# Patient Record
Sex: Female | Born: 1990 | Race: Black or African American | Hispanic: No | Marital: Single | Smoking: Current every day smoker
Health system: Southern US, Community
[De-identification: ages and names within clinical notes are randomized; demographics above are authoritative.]

## PROBLEM LIST (undated history)

## (undated) DIAGNOSIS — J45909 Unspecified asthma, uncomplicated: Secondary | ICD-10-CM

## (undated) DIAGNOSIS — R519 Headache, unspecified: Secondary | ICD-10-CM

## (undated) DIAGNOSIS — R51 Headache: Secondary | ICD-10-CM

---
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
## 2008-11-29 ENCOUNTER — Emergency Department (HOSPITAL_COMMUNITY): Admission: EM | Admit: 2008-11-29 | Discharge: 2008-11-29 | Payer: Self-pay | Admitting: Family Medicine

## 2009-06-24 ENCOUNTER — Emergency Department (HOSPITAL_COMMUNITY): Admission: EM | Admit: 2009-06-24 | Discharge: 2009-06-24 | Payer: Self-pay | Admitting: Emergency Medicine

## 2009-09-07 ENCOUNTER — Emergency Department (HOSPITAL_COMMUNITY): Admission: EM | Admit: 2009-09-07 | Discharge: 2009-09-07 | Payer: Self-pay | Admitting: Emergency Medicine

## 2010-02-28 ENCOUNTER — Emergency Department (HOSPITAL_COMMUNITY): Admission: EM | Admit: 2010-02-28 | Discharge: 2010-02-28 | Payer: Self-pay | Admitting: Emergency Medicine

## 2010-08-28 LAB — POCT URINALYSIS DIP (DEVICE)
Bilirubin Urine: NEGATIVE
Glucose, UA: NEGATIVE mg/dL
Ketones, ur: NEGATIVE mg/dL
Nitrite: NEGATIVE
Protein, ur: NEGATIVE mg/dL
Specific Gravity, Urine: 1.01 (ref 1.005–1.030)
Urobilinogen, UA: 0.2 mg/dL (ref 0.0–1.0)
pH: 6 (ref 5.0–8.0)

## 2010-08-28 LAB — HIV ANTIBODY (ROUTINE TESTING W REFLEX): HIV: NONREACTIVE

## 2010-08-28 LAB — GC/CHLAMYDIA PROBE AMP, GENITAL: Chlamydia, DNA Probe: POSITIVE — AB

## 2010-08-28 LAB — WET PREP, GENITAL: Trich, Wet Prep: NONE SEEN

## 2010-09-05 LAB — POCT I-STAT, CHEM 8
Calcium, Ion: 1.12 mmol/L (ref 1.12–1.32)
Creatinine, Ser: 0.7 mg/dL (ref 0.4–1.2)
Glucose, Bld: 94 mg/dL (ref 70–99)
Hemoglobin: 12.6 g/dL (ref 12.0–15.0)
Sodium: 136 mEq/L (ref 135–145)
TCO2: 21 mmol/L (ref 0–100)

## 2010-09-05 LAB — URINALYSIS, ROUTINE W REFLEX MICROSCOPIC
Ketones, ur: NEGATIVE mg/dL
Protein, ur: NEGATIVE mg/dL
Urobilinogen, UA: 0.2 mg/dL (ref 0.0–1.0)

## 2010-09-05 LAB — STREP A DNA PROBE: Group A Strep Probe: NEGATIVE

## 2010-09-05 LAB — URINE CULTURE: Colony Count: 50000

## 2010-09-05 LAB — PREGNANCY, URINE: Preg Test, Ur: NEGATIVE

## 2010-09-19 LAB — POCT PREGNANCY, URINE: Preg Test, Ur: NEGATIVE

## 2010-09-19 LAB — POCT URINALYSIS DIP (DEVICE)
Bilirubin Urine: NEGATIVE
Glucose, UA: NEGATIVE mg/dL
Ketones, ur: NEGATIVE mg/dL
Specific Gravity, Urine: 1.025 (ref 1.005–1.030)

## 2010-09-19 LAB — URINE CULTURE
Colony Count: NO GROWTH
Culture: NO GROWTH

## 2010-09-19 LAB — WET PREP, GENITAL

## 2010-11-23 ENCOUNTER — Emergency Department (HOSPITAL_COMMUNITY)
Admission: EM | Admit: 2010-11-23 | Discharge: 2010-11-23 | Disposition: A | Discharge status: Home / Self Care | Payer: Medicaid Other | Attending: Emergency Medicine | Admitting: Emergency Medicine

## 2010-11-23 DIAGNOSIS — L2989 Other pruritus: Secondary | ICD-10-CM | POA: Insufficient documentation

## 2010-11-23 DIAGNOSIS — L272 Dermatitis due to ingested food: Secondary | ICD-10-CM | POA: Insufficient documentation

## 2010-11-23 DIAGNOSIS — R232 Flushing: Secondary | ICD-10-CM | POA: Insufficient documentation

## 2010-11-23 DIAGNOSIS — L298 Other pruritus: Secondary | ICD-10-CM | POA: Insufficient documentation

## 2011-01-14 ENCOUNTER — Inpatient Hospital Stay (INDEPENDENT_AMBULATORY_CARE_PROVIDER_SITE_OTHER)
Admission: RE | Admit: 2011-01-14 | Discharge: 2011-01-14 | Disposition: A | Discharge status: Home / Self Care | Payer: Medicaid Other | Source: Ambulatory Visit | Attending: Emergency Medicine | Admitting: Emergency Medicine

## 2011-01-14 DIAGNOSIS — K089 Disorder of teeth and supporting structures, unspecified: Secondary | ICD-10-CM

## 2011-03-05 ENCOUNTER — Inpatient Hospital Stay (INDEPENDENT_AMBULATORY_CARE_PROVIDER_SITE_OTHER)
Admission: RE | Admit: 2011-03-05 | Discharge: 2011-03-05 | Disposition: A | Discharge status: Home / Self Care | Payer: Medicaid Other | Source: Ambulatory Visit | Attending: Emergency Medicine | Admitting: Emergency Medicine

## 2011-03-05 DIAGNOSIS — R109 Unspecified abdominal pain: Secondary | ICD-10-CM

## 2011-03-05 LAB — POCT PREGNANCY, URINE: Preg Test, Ur: NEGATIVE

## 2011-03-05 LAB — POCT URINALYSIS DIP (DEVICE)
Glucose, UA: NEGATIVE mg/dL
Ketones, ur: NEGATIVE mg/dL
Leukocytes, UA: NEGATIVE
Specific Gravity, Urine: 1.015 (ref 1.005–1.030)
Urobilinogen, UA: 0.2 mg/dL (ref 0.0–1.0)

## 2011-03-05 LAB — WET PREP, GENITAL: Yeast Wet Prep HPF POC: NONE SEEN

## 2011-03-06 LAB — GC/CHLAMYDIA PROBE AMP, GENITAL: Chlamydia, DNA Probe: NEGATIVE

## 2011-03-19 ENCOUNTER — Ambulatory Visit (INDEPENDENT_AMBULATORY_CARE_PROVIDER_SITE_OTHER): Payer: Medicaid Other

## 2011-03-19 ENCOUNTER — Inpatient Hospital Stay (INDEPENDENT_AMBULATORY_CARE_PROVIDER_SITE_OTHER)
Admission: RE | Admit: 2011-03-19 | Discharge: 2011-03-19 | Disposition: A | Discharge status: Home / Self Care | Payer: Medicaid Other | Source: Ambulatory Visit | Attending: Emergency Medicine | Admitting: Emergency Medicine

## 2011-03-19 DIAGNOSIS — S9030XA Contusion of unspecified foot, initial encounter: Secondary | ICD-10-CM

## 2011-03-19 DIAGNOSIS — S8000XA Contusion of unspecified knee, initial encounter: Secondary | ICD-10-CM

## 2011-03-20 LAB — POCT URINALYSIS DIP (DEVICE)
Bilirubin Urine: NEGATIVE
Ketones, ur: NEGATIVE
Operator id: 247071
Protein, ur: NEGATIVE
Specific Gravity, Urine: 1.01

## 2011-03-20 LAB — POCT PREGNANCY, URINE
Operator id: 247071
Preg Test, Ur: NEGATIVE

## 2011-06-22 ENCOUNTER — Encounter (HOSPITAL_COMMUNITY): Payer: Self-pay | Admitting: Emergency Medicine

## 2011-06-22 ENCOUNTER — Emergency Department (INDEPENDENT_AMBULATORY_CARE_PROVIDER_SITE_OTHER)
Admission: EM | Admit: 2011-06-22 | Discharge: 2011-06-22 | Disposition: A | Discharge status: Home / Self Care | Payer: Self-pay | Source: Home / Self Care | Attending: Emergency Medicine | Admitting: Emergency Medicine

## 2011-06-22 DIAGNOSIS — N760 Acute vaginitis: Secondary | ICD-10-CM

## 2011-06-22 LAB — POCT URINALYSIS DIP (DEVICE)
Leukocytes, UA: NEGATIVE
Nitrite: NEGATIVE
Protein, ur: 30 mg/dL — AB
Urobilinogen, UA: 0.2 mg/dL (ref 0.0–1.0)
pH: 5.5 (ref 5.0–8.0)

## 2011-06-22 LAB — WET PREP, GENITAL

## 2011-06-22 LAB — POCT PREGNANCY, URINE: Preg Test, Ur: NEGATIVE

## 2011-06-22 MED ORDER — METRONIDAZOLE 500 MG PO TABS: 500.0000 mg | ORAL_TABLET | Freq: Two times a day (BID) | ORAL | Status: AC

## 2011-06-22 MED ORDER — IBUPROFEN 800 MG PO TABS: 800.0000 mg | ORAL_TABLET | Freq: Three times a day (TID) | ORAL | Status: AC

## 2011-06-22 NOTE — ED Provider Notes (Signed)
Medical screening examination/treatment/procedure(s) were performed by non-physician practitioner and as supervising physician I was immediately available for consultation/collaboration.  Hillery Hunter, MD 06/22/11 [phone]

## 2011-06-22 NOTE — ED Notes (Signed)
HERE WITH RIGHT PELVIC CONSTANT DULL PAIN THAT STARTED YESTERDAY RADIATING TO LOWER BACK.NO VAG D/C OR FEVERS,N,V.PT HAS TRIED OTC PAIN MEDS AND HEATING PAD BUT NO RELIEF
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
## 2011-06-23 LAB — GC/CHLAMYDIA PROBE AMP, GENITAL: Chlamydia, DNA Probe: NEGATIVE

## 2011-11-11 ENCOUNTER — Encounter (HOSPITAL_COMMUNITY): Payer: Self-pay | Admitting: Emergency Medicine

## 2011-11-11 ENCOUNTER — Emergency Department (INDEPENDENT_AMBULATORY_CARE_PROVIDER_SITE_OTHER): Payer: Medicaid Other

## 2011-11-11 ENCOUNTER — Emergency Department (INDEPENDENT_AMBULATORY_CARE_PROVIDER_SITE_OTHER)
Admission: EM | Admit: 2011-11-11 | Discharge: 2011-11-11 | Disposition: A | Discharge status: Home / Self Care | Payer: Self-pay | Source: Home / Self Care | Attending: Emergency Medicine | Admitting: Emergency Medicine

## 2011-11-11 DIAGNOSIS — S63509A Unspecified sprain of unspecified wrist, initial encounter: Secondary | ICD-10-CM

## 2011-11-11 DIAGNOSIS — S0083XA Contusion of other part of head, initial encounter: Secondary | ICD-10-CM

## 2011-11-11 DIAGNOSIS — S0003XA Contusion of scalp, initial encounter: Secondary | ICD-10-CM

## 2011-11-11 MED ORDER — HYDROCODONE-ACETAMINOPHEN 5-325 MG PO TABS: 2.0000 | ORAL_TABLET | Freq: Three times a day (TID) | ORAL | Status: AC | PRN

## 2011-11-11 MED ORDER — HYDROCODONE-ACETAMINOPHEN 5-325 MG PO TABS: 2.0000 | ORAL_TABLET | Freq: Three times a day (TID) | ORAL | Status: DC | PRN

## 2011-11-11 MED ORDER — IBUPROFEN 800 MG PO TABS
800.0000 mg | ORAL_TABLET | Freq: Once | ORAL | Status: AC
  Administered 2011-11-11: 800 mg via ORAL

## 2011-11-11 MED ORDER — IBUPROFEN 800 MG PO TABS
ORAL_TABLET | ORAL | Status: AC
  Filled 2011-11-11: qty 1
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
## 2011-11-11 NOTE — Discharge Instructions (Signed)
Your x-rays were normal. As discussed continue with ice therapy. Can  take Lortab for the next 2 days as needed. Or preferably Motrin over-the-counter. Use provided splint for comfort no more than 2-3 days.

## 2011-11-11 NOTE — ED Notes (Signed)
Instructed patient to remove ring on finger on left hand, patient removed without difficulty

## 2011-11-11 NOTE — ED Notes (Signed)
Tim, emt applying splint 

## 2012-05-14 ENCOUNTER — Emergency Department (HOSPITAL_COMMUNITY)
Admission: EM | Admit: 2012-05-14 | Discharge: 2012-05-14 | Disposition: A | Discharge status: Home / Self Care | Payer: Self-pay | Attending: Emergency Medicine | Admitting: Emergency Medicine

## 2012-05-14 ENCOUNTER — Encounter (HOSPITAL_COMMUNITY): Payer: Self-pay | Admitting: *Deleted

## 2012-05-14 DIAGNOSIS — Z3202 Encounter for pregnancy test, result negative: Secondary | ICD-10-CM | POA: Insufficient documentation

## 2012-05-14 DIAGNOSIS — R42 Dizziness and giddiness: Secondary | ICD-10-CM | POA: Insufficient documentation

## 2012-05-14 DIAGNOSIS — R55 Syncope and collapse: Secondary | ICD-10-CM | POA: Insufficient documentation

## 2012-05-14 DIAGNOSIS — R61 Generalized hyperhidrosis: Secondary | ICD-10-CM | POA: Insufficient documentation

## 2012-05-14 DIAGNOSIS — F17200 Nicotine dependence, unspecified, uncomplicated: Secondary | ICD-10-CM | POA: Insufficient documentation

## 2012-05-14 LAB — POCT I-STAT, CHEM 8
BUN: <3 mg/dL — ABNORMAL LOW (ref 6–23)
Calcium, Ion: 1.26 mmol/L — ABNORMAL HIGH (ref 1.12–1.23)
Chloride: 104 mEq/L (ref 96–112)
Creatinine, Ser: 0.7 mg/dL (ref 0.50–1.10)
Glucose, Bld: 105 mg/dL — ABNORMAL HIGH (ref 70–99)
HCT: 37 % (ref 36.0–46.0)
Hemoglobin: 12.6 g/dL (ref 12.0–15.0)
Potassium: 3.8 mEq/L (ref 3.5–5.1)
Sodium: 140 mEq/L (ref 135–145)
TCO2: 25 mmol/L (ref 0–100)

## 2012-05-14 LAB — URINALYSIS, ROUTINE W REFLEX MICROSCOPIC
Glucose, UA: NEGATIVE mg/dL
Ketones, ur: NEGATIVE mg/dL
Leukocytes, UA: NEGATIVE
Nitrite: NEGATIVE
pH: 6 (ref 5.0–8.0)

## 2012-05-14 LAB — URINE MICROSCOPIC-ADD ON

## 2012-05-14 LAB — POCT PREGNANCY, URINE: Preg Test, Ur: NEGATIVE

## 2012-05-14 MED ORDER — IBUPROFEN 800 MG PO TABS
800.0000 mg | ORAL_TABLET | Freq: Once | ORAL | Status: AC
  Administered 2012-05-14: 800 mg via ORAL
  Filled 2012-05-14: qty 1
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
## 2012-05-14 NOTE — ED Provider Notes (Signed)
Medical screening examination/treatment/procedure(s) were performed by non-physician practitioner and as supervising physician I was immediately available for consultation/collaboration.   Loren Racer, MD 05/14/12 1520

## 2012-05-14 NOTE — ED Notes (Signed)
Patient went to the bathroom and could used it.
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
## 2012-06-15 ENCOUNTER — Emergency Department (HOSPITAL_COMMUNITY)
Admission: EM | Admit: 2012-06-15 | Discharge: 2012-06-16 | Disposition: A | Discharge status: Home / Self Care | Payer: Self-pay | Attending: Emergency Medicine | Admitting: Emergency Medicine

## 2012-06-15 ENCOUNTER — Encounter (HOSPITAL_COMMUNITY): Payer: Self-pay

## 2012-06-15 DIAGNOSIS — M25559 Pain in unspecified hip: Secondary | ICD-10-CM | POA: Insufficient documentation

## 2012-06-15 DIAGNOSIS — N898 Other specified noninflammatory disorders of vagina: Secondary | ICD-10-CM | POA: Insufficient documentation

## 2012-06-15 DIAGNOSIS — R102 Pelvic and perineal pain: Secondary | ICD-10-CM

## 2012-06-15 DIAGNOSIS — F17200 Nicotine dependence, unspecified, uncomplicated: Secondary | ICD-10-CM | POA: Insufficient documentation

## 2012-06-15 LAB — URINALYSIS, ROUTINE W REFLEX MICROSCOPIC
Bilirubin Urine: NEGATIVE
Glucose, UA: NEGATIVE mg/dL
Ketones, ur: NEGATIVE mg/dL
Leukocytes, UA: NEGATIVE
Protein, ur: NEGATIVE mg/dL
pH: 6 (ref 5.0–8.0)

## 2012-06-15 LAB — COMPREHENSIVE METABOLIC PANEL
Alkaline Phosphatase: 63 U/L (ref 39–117)
BUN: 6 mg/dL (ref 6–23)
Creatinine, Ser: 0.61 mg/dL (ref 0.50–1.10)
GFR calc Af Amer: >90 mL/min (ref >90)
Glucose, Bld: 123 mg/dL — ABNORMAL HIGH (ref 70–99)
Potassium: 3.6 mEq/L (ref 3.5–5.1)
Total Bilirubin: 0.1 mg/dL — ABNORMAL LOW (ref 0.3–1.2)
Total Protein: 7.8 g/dL (ref 6.0–8.3)

## 2012-06-15 LAB — WET PREP, GENITAL
Trich, Wet Prep: NONE SEEN
Yeast Wet Prep HPF POC: NONE SEEN

## 2012-06-15 LAB — CBC
HCT: 31.3 % — ABNORMAL LOW (ref 36.0–46.0)
Hemoglobin: 10.1 g/dL — ABNORMAL LOW (ref 12.0–15.0)
MCHC: 32.3 g/dL (ref 30.0–36.0)
MCV: 77.9 fL — ABNORMAL LOW (ref 78.0–100.0)
RDW: 15.8 % — ABNORMAL HIGH (ref 11.5–15.5)

## 2012-06-15 LAB — POCT PREGNANCY, URINE: Preg Test, Ur: NEGATIVE

## 2012-06-15 MED ORDER — METRONIDAZOLE 500 MG PO TABS
2000.0000 mg | ORAL_TABLET | Freq: Once | ORAL | Status: AC
  Administered 2012-06-16: 2000 mg via ORAL
  Filled 2012-06-15: qty 4

## 2012-06-15 MED ORDER — HYDROCODONE-ACETAMINOPHEN 5-325 MG PO TABS
1.0000 | ORAL_TABLET | Freq: Once | ORAL | Status: AC
  Administered 2012-06-16: 1 via ORAL
  Filled 2012-06-15: qty 1

## 2012-06-15 NOTE — ED Provider Notes (Addendum)
History     CSN: [id number] Arrival date & time 06/15/12  2039 First MD Initiated Contact with Patient 06/15/12 2201      Chief Complaint  Patient presents with  . Abdominal Pain     Patient is a 22 y.o. female presenting with abdominal pain. The history is provided by the patient.  Abdominal Pain The primary symptoms of the illness include abdominal pain. The primary symptoms of the illness do not include fever, fatigue, vomiting, diarrhea or dysuria. Episode onset: about 1.5 weeks ago, worse since yesterday. The onset of the illness was gradual. The problem has been gradually worsening.  The abdominal pain is located in the RLQ (dull). The abdominal pain radiates to the back. The abdominal pain is exacerbated by movement.  The patient has not had a change in bowel habit. Symptoms associated with the illness do not include anorexia.  She has had Normal menses.  History reviewed. No pertinent past medical history.  History reviewed. No pertinent past surgical history.  History reviewed. No pertinent family history.  History  Substance Use Topics  . Smoking status: Current Every Day Smoker -- 0.5 packs/day    Types: Cigarettes  . Smokeless tobacco: Not on file  . Alcohol Use: Yes     Comment: occa    OB History    Grav Para Term Preterm Abortions TAB SAB Ect Mult Living                  Review of Systems  Constitutional: Negative for fever and fatigue.  Gastrointestinal: Positive for abdominal pain. Negative for vomiting, diarrhea and anorexia.  Genitourinary: Negative for dysuria.  All other systems reviewed and are negative.    Allergies  Penicillins  Home Medications   Current Outpatient Rx  Name  Route  Sig  Dispense  Refill  . ACETAMINOPHEN 325 MG PO TABS   Oral   Take 650 mg by mouth every 6 (six) hours as needed. Pain         . IBUPROFEN 200 MG PO TABS   Oral   Take 200 mg by mouth every 6 (six) hours as needed. Pain           BP 149/87   Pulse 89  Temp 98.2 F (36.8 C) (Oral)  Resp 16  SpO2 100%  LMP 05/24/2012  Physical Exam  Nursing note and vitals reviewed. Constitutional: She appears well-developed and well-nourished. No distress.  HENT:  Head: Normocephalic and atraumatic.  Right Ear: External ear normal.  Left Ear: External ear normal.  Eyes: Conjunctivae normal are normal. Right eye exhibits no discharge. Left eye exhibits no discharge. No scleral icterus.  Neck: Neck supple. No tracheal deviation present.  Cardiovascular: Normal rate, regular rhythm and intact distal pulses.   Pulmonary/Chest: Effort normal and breath sounds normal. No stridor. No respiratory distress. She has no wheezes. She has no rales.  Abdominal: Soft. Bowel sounds are normal. She exhibits no distension. There is tenderness (mild rlq). There is no rebound and no guarding. Hernia confirmed negative in the right inguinal area and confirmed negative in the left inguinal area.  Genitourinary: Uterus is not enlarged and not tender. Cervix exhibits discharge. Cervix exhibits no motion tenderness and no friability. Right adnexum displays tenderness. Right adnexum displays no mass and no fullness. Left adnexum displays no mass and no fullness. Vaginal discharge found.  Musculoskeletal: She exhibits no edema and no tenderness.  Neurological: She is alert. She has normal strength. No sensory deficit. Cranial  nerve deficit:  no gross defecits noted. She exhibits normal muscle tone. She displays no seizure activity. Coordination normal.  Skin: Skin is warm and dry. No rash noted.  Psychiatric: She has a normal mood and affect.    ED Course  Procedures (including critical care time)  Labs Reviewed  URINALYSIS, ROUTINE W REFLEX MICROSCOPIC - Abnormal; Notable for the following:    Hgb urine dipstick SMALL (*)     All other components within normal limits  CBC - Abnormal; Notable for the following:    Hemoglobin 10.1 (*)     HCT 31.3 (*)     MCV  77.9 (*)     MCH 25.1 (*)     RDW 15.8 (*)     Platelets 498 (*)     All other components within normal limits  COMPREHENSIVE METABOLIC PANEL - Abnormal; Notable for the following:    Glucose, Bld 123 (*)     Total Bilirubin 0.1 (*)     All other components within normal limits  URINE MICROSCOPIC-ADD ON - Abnormal; Notable for the following:    Squamous Epithelial / LPF MANY (*)     All other components within normal limits  WET PREP, GENITAL - Abnormal; Notable for the following:    Clue Cells Wet Prep HPF POC FEW (*)     WBC, Wet Prep HPF POC FEW (*)     All other components within normal limits  POCT PREGNANCY, URINE  GC/CHLAMYDIA PROBE AMP  RPR   No results found.   1. Pelvic pain       MDM  Possible ovarian cyst clinically.  Doubt appendicitis.  Doubt PID, no uterine ttp or bilateral adnexal ttp.  Will treat with pain meds.  Check GC, chlamydia, treat for BV with the clue cells noted.  Follow up with Ob GYN      Celene Kras, MD 06/16/12 [phone]

## 2012-06-15 NOTE — ED Notes (Signed)
Pt states lower right abdominal pain to rt side radiating to back.  Pt states no difficulty urinating and no burning with urination.  No fevers.  Pt states difficulty walking d/t pain.

## 2012-06-16 MED ORDER — HYDROCODONE-ACETAMINOPHEN 5-325 MG PO TABS: 1.0000 | ORAL_TABLET | Freq: Four times a day (QID) | ORAL | Status: DC | PRN

## 2012-06-16 MED ORDER — NAPROXEN 500 MG PO TABS: 500.0000 mg | ORAL_TABLET | Freq: Two times a day (BID) | ORAL | Status: DC

## 2012-06-17 LAB — GC/CHLAMYDIA PROBE AMP: CT Probe RNA: NEGATIVE

## 2012-09-24 ENCOUNTER — Encounter (HOSPITAL_COMMUNITY): Payer: Self-pay | Admitting: *Deleted

## 2012-09-24 ENCOUNTER — Emergency Department (HOSPITAL_COMMUNITY)
Admission: EM | Admit: 2012-09-24 | Discharge: 2012-09-25 | Disposition: A | Discharge status: Home / Self Care | Payer: Self-pay | Attending: Emergency Medicine | Admitting: Emergency Medicine

## 2012-09-24 DIAGNOSIS — F17200 Nicotine dependence, unspecified, uncomplicated: Secondary | ICD-10-CM | POA: Insufficient documentation

## 2012-09-24 DIAGNOSIS — T7840XA Allergy, unspecified, initial encounter: Secondary | ICD-10-CM

## 2012-09-24 DIAGNOSIS — Z79899 Other long term (current) drug therapy: Secondary | ICD-10-CM | POA: Insufficient documentation

## 2012-09-24 DIAGNOSIS — R062 Wheezing: Secondary | ICD-10-CM | POA: Insufficient documentation

## 2012-09-24 DIAGNOSIS — T628X1A Toxic effect of other specified noxious substances eaten as food, accidental (unintentional), initial encounter: Secondary | ICD-10-CM | POA: Insufficient documentation

## 2012-09-24 DIAGNOSIS — Y929 Unspecified place or not applicable: Secondary | ICD-10-CM | POA: Insufficient documentation

## 2012-09-24 DIAGNOSIS — L509 Urticaria, unspecified: Secondary | ICD-10-CM | POA: Insufficient documentation

## 2012-09-24 DIAGNOSIS — Y939 Activity, unspecified: Secondary | ICD-10-CM | POA: Insufficient documentation

## 2012-09-24 DIAGNOSIS — R197 Diarrhea, unspecified: Secondary | ICD-10-CM | POA: Insufficient documentation

## 2012-09-24 DIAGNOSIS — R1110 Vomiting, unspecified: Secondary | ICD-10-CM | POA: Insufficient documentation

## 2012-09-24 NOTE — ED Notes (Signed)
Pt states she thinks she's having an allergic reaction to fish she ate around 10pm tonight, never been allergic before but hasn't eaten fish in a while, states has n/v/d, itching all over body, hives and stomach burning. Denies SOB.

## 2012-09-25 LAB — POCT I-STAT, CHEM 8
HCT: 37 % (ref 36.0–46.0)
Hemoglobin: 12.6 g/dL (ref 12.0–15.0)
Potassium: 3.7 mEq/L (ref 3.5–5.1)
Sodium: 140 mEq/L (ref 135–145)
TCO2: 23 mmol/L (ref 0–100)

## 2012-09-25 MED ORDER — DIPHENHYDRAMINE HCL 50 MG/ML IJ SOLN
25.0000 mg | Freq: Once | INTRAMUSCULAR | Status: DC
  Filled 2012-09-25: qty 1

## 2012-09-25 MED ORDER — DIPHENHYDRAMINE HCL 25 MG PO CAPS
25.0000 mg | ORAL_CAPSULE | Freq: Once | ORAL | Status: AC
  Administered 2012-09-25: 25 mg via ORAL
  Filled 2012-09-25: qty 1

## 2012-09-25 MED ORDER — FAMOTIDINE 20 MG PO TABS
20.0000 mg | ORAL_TABLET | Freq: Once | ORAL | Status: AC
  Administered 2012-09-25: 20 mg via ORAL
  Filled 2012-09-25: qty 1

## 2012-09-25 MED ORDER — SODIUM CHLORIDE 0.9 % IV SOLN: INTRAVENOUS | Status: DC

## 2012-09-25 MED ORDER — PREDNISONE 20 MG PO TABS
60.0000 mg | ORAL_TABLET | Freq: Once | ORAL | Status: AC
  Administered 2012-09-25: 60 mg via ORAL
  Filled 2012-09-25: qty 3

## 2012-09-25 NOTE — ED Notes (Signed)
Patient denies itching or shortness of breath. Patient given Ginger Ale and graham crackers.

## 2012-09-25 NOTE — ED Provider Notes (Signed)
Medical screening examination/treatment/procedure(s) were performed by non-physician practitioner and as supervising physician I was immediately available for consultation/collaboration.  Olivia Mackie, MD 09/25/12 [phone]

## 2012-09-25 NOTE — ED Provider Notes (Signed)
History     CSN: [id number]  Arrival date & time 09/24/12  2316   First MD Initiated Contact with Patient 09/24/12 2357      Chief Complaint  Patient presents with  . Allergic Reaction  . Diarrhea  . Emesis  . Pruritis    (Consider location/radiation/quality/duration/timing/severity/associated sxs/prior treatment) HPI Comments: Hx of shellfish allergy, symptom onset after frozen white fish consumption at 8 or 9 pm. Nobody else consumed similar food.   Patient is a 22 y.o. female presenting with allergic reaction. The history is provided by the patient.  Allergic Reaction The primary symptoms are  wheezing (since resolved), vomiting (x3), diarrhea (2x) and urticaria. The primary symptoms do not include shortness of breath, cough, nausea, dizziness, palpitations, rash or angioedema. The current episode started 3 to 5 hours ago. The problem has been rapidly improving. This is a new problem.  Urticaria is located on the face (hands & "my lips feel swollen").  The onset of the reaction was associated with eating (pt reports eating frozen white fish ).    History reviewed. No pertinent past medical history.  History reviewed. No pertinent past surgical history.  No family history on file.  History  Substance Use Topics  . Smoking status: Current Every Day Smoker -- 0.50 packs/day    Types: Cigarettes  . Smokeless tobacco: Not on file  . Alcohol Use: Yes     Comment: occa    OB History   Grav Para Term Preterm Abortions TAB SAB Ect Mult Living                  Review of Systems  Respiratory: Positive for wheezing (since resolved). Negative for cough and shortness of breath.   Cardiovascular: Negative for palpitations.  Gastrointestinal: Positive for vomiting (x3) and diarrhea (2x). Negative for nausea.  Skin: Negative for rash.  Neurological: Negative for dizziness.  All other systems reviewed and are negative.    Allergies  Peanut-containing drug products; Mustard  seed; Shellfish allergy; Tomato; and Penicillins  Home Medications   Current Outpatient Rx  Name  Route  Sig  Dispense  Refill  . buPROPion (ZYBAN) 150 MG 12 hr tablet   Oral   Take 150 mg by mouth 2 (two) times daily.         . diphenhydrAMINE (BENADRYL) 12.5 MG/5ML liquid   Oral   Take 12.5 mg by mouth once as needed (for allergic reaction).           BP 147/84  Pulse 101  Temp(Src) 97.9 F (36.6 C) (Oral)  Resp 18  SpO2 100%  LMP 09/23/2012  Physical Exam  Nursing note and vitals reviewed. Constitutional: She is oriented to person, place, and time. She appears well-developed and well-nourished. No distress.  HENT:  Head: Normocephalic and atraumatic.  Mouth/Throat: Oropharynx is clear and moist and mucous membranes are normal.  No sign of airway obstruction. No edema of face, eyelids, lips, tongue, uvula.Marland Kitchen Uvula midline, no nasal congestion or drooling.  Tongue not elevated. No trismus.  Neck: Trachea normal, normal range of motion and full passive range of motion without pain. Neck supple. Carotid bruit is not present. No tracheal deviation present.  No carotid bruits or stridor  Cardiovascular: Normal rate, regular rhythm, intact distal pulses and normal pulses.   Not tachycardic  Pulmonary/Chest: Effort normal. No stridor.  Abdominal:  Diffuse abd tenderness, with mild midline localization.   Musculoskeletal: Normal range of motion.  Neurological: She is alert and oriented  to person, place, and time.  Skin: Skin is warm and intact. Rash noted. Rash is urticarial. She is not diaphoretic.  Not diaphoretic. No urticaria, petechiae or purpura.   Psychiatric: She has a normal mood and affect. Her behavior is normal.    ED Course  Procedures (including critical care time)  Labs Reviewed - No data to display No results found.   No diagnosis found.   BP 147/84  Pulse 101  Temp(Src) 97.9 F (36.6 C) (Oral)  Resp 18  SpO2 100%  LMP 09/23/2012  MDM   Allergic reaction, possible  Patient re-evaluated prior to dc, is hemodynamically stable, in no respiratory distress, and denies the feeling of throat closing. Pt has been advised to take OTC benadryl & return to the ED if they have a mod-severe allergic rxn (s/s including throat closing, difficulty breathing, swelling of lips face or tongue). Pt is to follow up with their PCP. Pt is agreeable with plan & verbalizes understanding.         Jaci Carrel, [place] 09/25/12 [phone]

## 2012-09-25 NOTE — ED Notes (Signed)
Patient tolerated ginger ale and graham crackers with nausea, vomiting or difficulty swallowing.

## 2012-11-09 ENCOUNTER — Emergency Department (HOSPITAL_COMMUNITY): Payer: Self-pay

## 2012-11-09 ENCOUNTER — Emergency Department (HOSPITAL_COMMUNITY)
Admission: EM | Admit: 2012-11-09 | Discharge: 2012-11-09 | Disposition: A | Discharge status: Home / Self Care | Payer: Self-pay | Attending: Emergency Medicine | Admitting: Emergency Medicine

## 2012-11-09 ENCOUNTER — Encounter (HOSPITAL_COMMUNITY): Payer: Self-pay | Admitting: *Deleted

## 2012-11-09 DIAGNOSIS — S9030XA Contusion of unspecified foot, initial encounter: Secondary | ICD-10-CM | POA: Insufficient documentation

## 2012-11-09 DIAGNOSIS — Y92009 Unspecified place in unspecified non-institutional (private) residence as the place of occurrence of the external cause: Secondary | ICD-10-CM | POA: Insufficient documentation

## 2012-11-09 DIAGNOSIS — F17200 Nicotine dependence, unspecified, uncomplicated: Secondary | ICD-10-CM | POA: Insufficient documentation

## 2012-11-09 DIAGNOSIS — S9031XA Contusion of right foot, initial encounter: Secondary | ICD-10-CM

## 2012-11-09 DIAGNOSIS — Y9389 Activity, other specified: Secondary | ICD-10-CM | POA: Insufficient documentation

## 2012-11-09 DIAGNOSIS — R209 Unspecified disturbances of skin sensation: Secondary | ICD-10-CM | POA: Insufficient documentation

## 2012-11-09 DIAGNOSIS — W208XXA Other cause of strike by thrown, projected or falling object, initial encounter: Secondary | ICD-10-CM | POA: Insufficient documentation

## 2012-11-09 DIAGNOSIS — Z880 Allergy status to penicillin: Secondary | ICD-10-CM | POA: Insufficient documentation

## 2012-11-09 MED ORDER — IBUPROFEN 800 MG PO TABS
800.0000 mg | ORAL_TABLET | Freq: Once | ORAL | Status: AC
  Administered 2012-11-09: 800 mg via ORAL
  Filled 2012-11-09: qty 1

## 2012-11-09 NOTE — ED Provider Notes (Signed)
History     CSN: [id number]  Arrival date & time 11/09/12  1146   First MD Initiated Contact with Patient 11/09/12 1213      Chief Complaint  Patient presents with  . Foot Pain    right foot    (Consider location/radiation/quality/duration/timing/severity/associated sxs/prior treatment) HPI Comments: Patient reports she accidentally dropped a dressing room mirror on her right foot today.  Has pain in his 2nd, 3rd, 4th toes that is sharp with walking and throbbing without weight bearing.  Has some tingling in the tips of her toes.  Denies weakness or difficulty moving toes.  Denies any other injury.  The mirror did not break.    Patient is a 22 y.o. female presenting with lower extremity pain. The history is provided by the patient.  Foot Pain Pertinent negatives include no weakness.    History reviewed. No pertinent past medical history.  History reviewed. No pertinent past surgical history.  No family history on file.  History  Substance Use Topics  . Smoking status: Current Every Day Smoker -- 0.50 packs/day    Types: Cigarettes  . Smokeless tobacco: Not on file  . Alcohol Use: Yes     Comment: occa    OB History   Grav Para Term Preterm Abortions TAB SAB Ect Mult Living                  Review of Systems  Skin: Negative for color change and wound.  Neurological: Negative for weakness.    Allergies  Peanut-containing drug products; Mustard seed; Shellfish allergy; Tomato; and Penicillins  Home Medications   Current Outpatient Rx  Name  Route  Sig  Dispense  Refill  . diphenhydrAMINE (BENADRYL) 12.5 MG/5ML liquid   Oral   Take 12.5 mg by mouth once as needed (for allergic reaction).           BP 137/93  Pulse 87  Temp(Src) 99.1 F (37.3 C) (Oral)  Resp 18  SpO2 99%  LMP 10/29/2012  Physical Exam  Nursing note and vitals reviewed. Constitutional: She appears well-developed and well-nourished. No distress.  HENT:  Head: Normocephalic and  atraumatic.  Neck: Neck supple.  Pulmonary/Chest: Effort normal.  Musculoskeletal: Normal range of motion. She exhibits tenderness. She exhibits no edema.       Feet:  Right foot tender over 2nd, 3rd, 4th digits.  No break in skin. No erythema, edema, warmth.  Full AROM without difficulty, sensation intact, capillary refill < 2 seconds.   Neurological: She is alert.  Skin: She is not diaphoretic.    ED Course  Procedures (including critical care time)  Labs Reviewed - No data to display Dg Foot Complete Right  11/09/2012   *RADIOLOGY REPORT*  Clinical Data: Pain right foot., an object on to reflect this morning.  RIGHT FOOT COMPLETE - 3+ VIEW  Comparison: None.  Findings: No fracture.  The joints normally spaced and aligned. The soft tissues are unremarkable.  IMPRESSION: Normal right foot radiographs.   Original Report Authenticated By: Amie Portland, M.D.    1. Foot contusion, right, initial encounter     MDM  Patient who accidentally dropped large mirror on right foot.  Xray negative for fracture.  Neurovascularly intact.  No laceration or abrasion.  D/C home with post op shoe, OTC medications for pain.  Discussed all results with patient.  Pt given return precautions.  Pt verbalizes understanding and agrees with plan.  Trixie Dredge, PA-C 11/09/12 1246

## 2012-11-09 NOTE — ED Notes (Addendum)
Pt dropped a mirror on her left foot @ 1 hour PTA.  Pt c/o left lateral foot pain.  Pt is able to move toes of left foot and can put weight on foot, but not without pain.  Pt is able to ambulate, but not without pain.  Pt has good pulses.

## 2012-11-09 NOTE — ED Notes (Signed)
Pt comes from home with c/o pain in left foot after dropping a mirror on her left foot.  There is no obvious swelling or bruising to pt's left foot.

## 2012-11-10 NOTE — ED Provider Notes (Signed)
Medical screening examination/treatment/procedure(s) were performed by non-physician practitioner and as supervising physician I was immediately available for consultation/collaboration.  Jaidin Richison T Tailyn Hantz, MD 11/10/12 0957 

## 2013-02-12 ENCOUNTER — Emergency Department (HOSPITAL_COMMUNITY)
Admission: EM | Admit: 2013-02-12 | Discharge: 2013-02-12 | Disposition: A | Discharge status: Home / Self Care | Payer: Medicaid Other | Attending: Emergency Medicine | Admitting: Emergency Medicine

## 2013-02-12 ENCOUNTER — Emergency Department (HOSPITAL_COMMUNITY): Payer: Medicaid Other

## 2013-02-12 ENCOUNTER — Encounter (HOSPITAL_COMMUNITY): Payer: Self-pay

## 2013-02-12 DIAGNOSIS — F17200 Nicotine dependence, unspecified, uncomplicated: Secondary | ICD-10-CM | POA: Insufficient documentation

## 2013-02-12 DIAGNOSIS — Z880 Allergy status to penicillin: Secondary | ICD-10-CM | POA: Insufficient documentation

## 2013-02-12 DIAGNOSIS — J40 Bronchitis, not specified as acute or chronic: Secondary | ICD-10-CM | POA: Insufficient documentation

## 2013-02-12 DIAGNOSIS — H579 Unspecified disorder of eye and adnexa: Secondary | ICD-10-CM | POA: Insufficient documentation

## 2013-02-12 MED ORDER — PREDNISONE 20 MG PO TABS
60.0000 mg | ORAL_TABLET | Freq: Once | ORAL | Status: AC
  Administered 2013-02-12: 60 mg via ORAL
  Filled 2013-02-12: qty 3

## 2013-02-12 MED ORDER — ALBUTEROL SULFATE (5 MG/ML) 0.5% IN NEBU
5.0000 mg | INHALATION_SOLUTION | RESPIRATORY_TRACT | Status: AC
  Administered 2013-02-12 (×3): 5 mg via RESPIRATORY_TRACT
  Filled 2013-02-12 (×3): qty 1

## 2013-02-12 MED ORDER — AZITHROMYCIN 250 MG PO TABS: 250.0000 mg | ORAL_TABLET | Freq: Every day | ORAL | Status: DC

## 2013-02-12 MED ORDER — PREDNISONE 50 MG PO TABS: 50.0000 mg | ORAL_TABLET | Freq: Every day | ORAL | Status: DC

## 2013-02-12 MED ORDER — ALBUTEROL SULFATE HFA 108 (90 BASE) MCG/ACT IN AERS: 1.0000 | INHALATION_SPRAY | Freq: Four times a day (QID) | RESPIRATORY_TRACT | Status: DC | PRN

## 2013-02-12 MED ORDER — IPRATROPIUM BROMIDE 0.02 % IN SOLN
0.5000 mg | Freq: Once | RESPIRATORY_TRACT | Status: AC
  Administered 2013-02-12: 0.5 mg via RESPIRATORY_TRACT
  Filled 2013-02-12: qty 2.5

## 2013-02-12 NOTE — ED Notes (Signed)
Patient c/o sore throat, sinus drainage, and burning sensation of both eyes.

## 2013-02-12 NOTE — ED Provider Notes (Signed)
CSN: [id number]     Arrival date & time 02/12/13  0865 History   First MD Initiated Contact with Patient 02/12/13 0830     Chief Complaint  Patient presents with  . Sore Throat  . Burning Eyes   (Consider location/radiation/quality/duration/timing/severity/associated sxs/prior Treatment) HPI Comments: Pt comes in with cc of sore throat, coughing and burning to both eyes. Pt has no medical hx. States that for the past 2 days, she has some URI like sx, with cough - yellow phlegm, some dib, chest pain only when she coughs, and post nasal discharge. Pt's mother just had sinusitis, and completed antibiotics. No hx of PE, DVT, no substance abuse, no cardiac hx, denies being pregnant.    Patient is a 22 y.o. female presenting with pharyngitis. The history is provided by the patient.  Sore Throat Associated symptoms include chest pain and shortness of breath. Pertinent negatives include no abdominal pain and no headaches.    History reviewed. No pertinent past medical history. History reviewed. No pertinent past surgical history. Family History  Problem Relation Age of Onset  . Hypertension Mother   . Lupus Sister    History  Substance Use Topics  . Smoking status: Current Every Day Smoker -- 0.50 packs/day    Types: Cigarettes  . Smokeless tobacco: Never Used  . Alcohol Use: Yes     Comment: occa   OB History   Grav Para Term Preterm Abortions TAB SAB Ect Mult Living                 Review of Systems  Constitutional: Negative for activity change.  HENT: Positive for congestion, rhinorrhea, postnasal drip and sinus pressure. Negative for neck pain.   Eyes: Negative for visual disturbance.  Respiratory: Positive for cough, shortness of breath and wheezing.   Cardiovascular: Positive for chest pain.  Gastrointestinal: Negative for nausea, vomiting and abdominal pain.  Genitourinary: Negative for dysuria.  Neurological: Negative for headaches.    Allergies  Peanut-containing  drug products; Mustard seed; Shellfish allergy; Tomato; and Penicillins  Home Medications   Current Outpatient Rx  Name  Route  Sig  Dispense  Refill  . albuterol (PROVENTIL HFA;VENTOLIN HFA) 108 (90 BASE) MCG/ACT inhaler   Inhalation   Inhale 2 puffs into the lungs every 6 (six) hours as needed for wheezing or shortness of breath.         . guaiFENesin (MUCINEX) 600 MG 12 hr tablet   Oral   Take 600 mg by mouth 2 (two) times daily as needed for congestion.         Marland Kitchen ibuprofen (ADVIL,MOTRIN) 200 MG tablet   Oral   Take 400 mg by mouth every 8 (eight) hours as needed for pain.          BP 140/82  Pulse 98  Temp(Src) 98.6 F (37 C) (Oral)  Resp 20  SpO2 94%  LMP 02/11/2013 Physical Exam  Nursing note and vitals reviewed. Constitutional: She is oriented to person, place, and time. She appears well-developed and well-nourished.  HENT:  Head: Normocephalic and atraumatic.  Eyes: EOM are normal. Pupils are equal, round, and reactive to light.  Neck: Neck supple.  Cardiovascular: Normal rate, regular rhythm and normal heart sounds.   No murmur heard. Pulmonary/Chest: Effort normal. No respiratory distress. She has wheezes.  Diffuse inspiratory and expiratory wheezing  Abdominal: Soft. She exhibits no distension. There is no tenderness. There is no rebound and no guarding.  Neurological: She is alert and oriented  to person, place, and time.  Skin: Skin is warm and dry.    ED Course  Procedures (including critical care time) Labs Review Labs Reviewed - No data to display Imaging Review No results found.  MDM  No diagnosis found. Pt comes in with cc of uri like sx, + cough, + wheezing on the exam.  DDX includes: Viral syndrome Influenza Pharyngitis Sinusitis Bronchitis PNA  Based on the exam, we will get xray to ensure there is no PNA (she has fevers at home). Will treat as bronchitis otherwise, antibiotics for wait and see approach.    Derwood Kaplan,  MD 02/12/13 [phone]

## 2013-07-08 ENCOUNTER — Encounter (HOSPITAL_COMMUNITY): Payer: Self-pay | Admitting: Emergency Medicine

## 2013-07-08 ENCOUNTER — Emergency Department (HOSPITAL_COMMUNITY)
Admission: EM | Admit: 2013-07-08 | Discharge: 2013-07-08 | Disposition: A | Discharge status: Home / Self Care | Payer: Medicaid Other | Attending: Emergency Medicine | Admitting: Emergency Medicine

## 2013-07-08 DIAGNOSIS — Z792 Long term (current) use of antibiotics: Secondary | ICD-10-CM | POA: Insufficient documentation

## 2013-07-08 DIAGNOSIS — B3731 Acute candidiasis of vulva and vagina: Secondary | ICD-10-CM | POA: Insufficient documentation

## 2013-07-08 DIAGNOSIS — Z791 Long term (current) use of non-steroidal anti-inflammatories (NSAID): Secondary | ICD-10-CM | POA: Insufficient documentation

## 2013-07-08 DIAGNOSIS — Z3202 Encounter for pregnancy test, result negative: Secondary | ICD-10-CM | POA: Insufficient documentation

## 2013-07-08 DIAGNOSIS — N739 Female pelvic inflammatory disease, unspecified: Secondary | ICD-10-CM

## 2013-07-08 DIAGNOSIS — Z880 Allergy status to penicillin: Secondary | ICD-10-CM | POA: Insufficient documentation

## 2013-07-08 DIAGNOSIS — F17200 Nicotine dependence, unspecified, uncomplicated: Secondary | ICD-10-CM | POA: Insufficient documentation

## 2013-07-08 DIAGNOSIS — B373 Candidiasis of vulva and vagina: Secondary | ICD-10-CM

## 2013-07-08 LAB — URINE MICROSCOPIC-ADD ON

## 2013-07-08 LAB — URINALYSIS, ROUTINE W REFLEX MICROSCOPIC
BILIRUBIN URINE: NEGATIVE
GLUCOSE, UA: NEGATIVE mg/dL
Ketones, ur: NEGATIVE mg/dL
Nitrite: NEGATIVE
PH: 5.5 (ref 5.0–8.0)
Protein, ur: NEGATIVE mg/dL
SPECIFIC GRAVITY, URINE: 1.03 (ref 1.005–1.030)
Urobilinogen, UA: 0.2 mg/dL (ref 0.0–1.0)

## 2013-07-08 LAB — WET PREP, GENITAL
CLUE CELLS WET PREP: NONE SEEN
TRICH WET PREP: NONE SEEN

## 2013-07-08 LAB — PREGNANCY, URINE: PREG TEST UR: NEGATIVE

## 2013-07-08 MED ORDER — OXYCODONE-ACETAMINOPHEN 5-325 MG PO TABS
1.0000 | ORAL_TABLET | Freq: Once | ORAL | Status: AC
  Administered 2013-07-08: 1 via ORAL
  Filled 2013-07-08: qty 1

## 2013-07-08 MED ORDER — AZITHROMYCIN 250 MG PO TABS
1000.0000 mg | ORAL_TABLET | Freq: Every day | ORAL | Status: DC
  Administered 2013-07-08: 1000 mg via ORAL
  Filled 2013-07-08: qty 4

## 2013-07-08 MED ORDER — DOXYCYCLINE HYCLATE 100 MG PO CAPS: 100.0000 mg | ORAL_CAPSULE | Freq: Two times a day (BID) | ORAL | Status: DC

## 2013-07-08 MED ORDER — CEFTRIAXONE SODIUM 250 MG IJ SOLR
250.0000 mg | INTRAMUSCULAR | Status: DC
  Administered 2013-07-08: 250 mg via INTRAMUSCULAR
  Filled 2013-07-08: qty 250

## 2013-07-08 MED ORDER — FLUCONAZOLE 200 MG PO TABS: ORAL_TABLET | ORAL | Status: DC

## 2013-07-08 MED ORDER — OXYCODONE-ACETAMINOPHEN 5-325 MG PO TABS: 2.0000 | ORAL_TABLET | ORAL | Status: DC | PRN

## 2013-07-08 NOTE — Discharge Instructions (Signed)
Candida Infection, Adult A candida infection (also called yeast, fungus and Monilia infection) is an overgrowth of yeast that can occur anywhere on the body. A yeast infection commonly occurs in warm, moist body areas. Usually, the infection remains localized but can spread to become a systemic infection. A yeast infection may be a sign of a more severe disease such as diabetes, leukemia, or AIDS. A yeast infection can occur in both men and women. In women, Candida vaginitis is a vaginal infection. It is one of the most common causes of vaginitis. Men usually do not have symptoms or know they have an infection until other problems develop. Men may find out they have a yeast infection because their sex partner has a yeast infection. Uncircumcised men are more likely to get a yeast infection than circumcised men. This is because the uncircumcised glans is not exposed to air and does not remain as dry as that of a circumcised glans. Older adults may develop yeast infections around dentures. CAUSES  Women  Antibiotics.  Steroid medication taken for a long time.  Being overweight (obese).  Diabetes.  Poor immune condition.  Certain serious medical conditions.  Immune suppressive medications for organ transplant patients.  Chemotherapy.  Pregnancy.  Menstration.  Stress and fatigue.  Intravenous drug use.  Oral contraceptives.  Wearing tight-fitting clothes in the crotch area.  Catching it from a sex partner who has a yeast infection.  Spermicide.  Intravenous, urinary, or other catheters. Men  Catching it from a sex partner who has a yeast infection.  Having oral or anal sex with a person who has the infection.  Spermicide.  Diabetes.  Antibiotics.  Poor immune system.  Medications that suppress the immune system.  Intravenous drug use.  Intravenous, urinary, or other catheters. SYMPTOMS  Women  Thick, white vaginal discharge.  Vaginal itching.  Redness and  swelling in and around the vagina.  Irritation of the lips of the vagina and perineum.  Blisters on the vaginal lips and perineum.  Painful sexual intercourse.  Low blood sugar (hypoglycemia).  Painful urination.  Bladder infections.  Intestinal problems such as constipation, indigestion, bad breath, bloating, increase in gas, diarrhea, or loose stools. Men  Men may develop intestinal problems such as constipation, indigestion, bad breath, bloating, increase in gas, diarrhea, or loose stools.  Dry, cracked skin on the penis with itching or discomfort.  Jock itch.  Dry, flaky skin.  Athlete's foot.  Hypoglycemia. DIAGNOSIS  Women  A history and an exam are performed.  The discharge may be examined under a microscope.  A culture may be taken of the discharge. Men  A history and an exam are performed.  Any discharge from the penis or areas of cracked skin will be looked at under the microscope and cultured.  Stool samples may be cultured. TREATMENT  Women  Vaginal antifungal suppositories and creams.  Medicated creams to decrease irritation and itching on the outside of the vagina.  Warm compresses to the perineal area to decrease swelling and discomfort.  Oral antifungal medications.  Medicated vaginal suppositories or cream for repeated or recurrent infections.  Wash and dry the irritation areas before applying the cream.  Eating yogurt with lactobacillus may help with prevention and treatment.  Sometimes painting the vagina with gentian violet solution may help if creams and suppositories do not work. Men  Antifungal creams and oral antifungal medications.  Sometimes treatment must continue for 30 days after the symptoms go away to prevent recurrence. HOME CARE   INSTRUCTIONS  Women  Use cotton underwear and avoid tight-fitting clothing.  Avoid colored, scented toilet paper and deodorant tampons or pads.  Do not douche.  Keep your diabetes  under control.  Finish all the prescribed medications.  Keep your skin clean and dry.  Consume milk or yogurt with lactobacillus active culture regularly. If you get frequent yeast infections and think that is what the infection is, there are over-the-counter medications that you can get. If the infection does not show healing in 3 days, talk to your caregiver.  Tell your sex partner you have a yeast infection. Your partner may need treatment also, especially if your infection does not clear up or recurs. Men  Keep your skin clean and dry.  Keep your diabetes under control.  Finish all prescribed medications.  Tell your sex partner that you have a yeast infection so they can be treated if necessary. SEEK MEDICAL CARE IF:   Your symptoms do not clear up or worsen in one week after treatment.  You have an oral temperature above 102 F (38.9 C).  You have trouble swallowing or eating for a prolonged time.  You develop blisters on and around your vagina.  You develop vaginal bleeding and it is not your menstrual period.  You develop abdominal pain.  You develop intestinal problems as mentioned above.  You get weak or lightheaded.  You have painful or increased urination.  You have pain during sexual intercourse. MAKE SURE YOU:   Understand these instructions.  Will watch your condition.  Will get help right away if you are not doing well or get worse. Document Released: 07/06/2004 Document Revised: 08/21/2011 Document Reviewed: 10/18/2009 [facility] Patient Information 2014 [place], [place].  Pelvic Inflammatory Disease Pelvic inflammatory disease (PID) is an infection in some or all of the female organs. PID can be in the uterus, ovaries, fallopian tubes, or the surrounding tissues inside the lower belly area (pelvis). HOME CARE   If given, take your antibiotic medicine as told. Finish them even if you start to feel better.  Only take medicine as told by your  doctor.  Do not have sex (intercourse) until treatment is done or as told by your doctor.  Tell your sex partner if you have PID. Your partner may need to be treated.  Keep all doctor visits. GET HELP RIGHT AWAY IF:   You have a fever.  You have more belly (abdominal) or lower belly pain.  You have chills.  You have pain when you pee (urinate).  You are not better after 72 hours.  You have more fluid (discharge) coming from your vagina or fluid that is not normal.  You need pain medicine from your doctor.  You throw up (vomit).  You cannot take your medicines.  Your partner has a sexually transmitted disease (STD). MAKE SURE YOU:   Understand these instructions.  Will watch your condition.  Will get help right away if you are not doing well or get worse. Document Released: 08/25/2008 Document Revised: 09/23/2012 Document Reviewed: 05/25/2011 [facility]Exit[facility] Patient Information 2014 [place]ExitCare, [place].

## 2013-07-08 NOTE — ED Notes (Signed)
Started with yeast infection yesterday; used monistat cream yesterday; developed bilateral lower abd pain last night; worse on left side; c/o white discharge/itching

## 2013-07-08 NOTE — ED Provider Notes (Signed)
CSN: [id number]     Arrival date & time 07/08/13  1497 History   First MD Initiated Contact with Patient 07/08/13 [phone]     Chief Complaint  Patient presents with  . Abdominal Pain    HPI  Patient presents with a complaint of vaginal discharge, and bilateral pelvic pain. She's had pelvic infections in the past. Had an episode of Chlamydia 4 years ago. Denies any new partners. Has had yeast infections. Days ago she thought she was getting a yeast infection with burning. She used Monistat yesterday, and this morning. She developed lower abdominal pain and she was concerned this might be something wrong is cream". She is midcycle, approximately 2 weeks away from her period.  She has had an ovarian cyst. This feels different. She denies pregnancy.  History reviewed. No pertinent past medical history. History reviewed. No pertinent past surgical history. Family History  Problem Relation Age of Onset  . Hypertension Mother   . Lupus Sister    History  Substance Use Topics  . Smoking status: Current Every Day Smoker -- 0.50 packs/day    Types: Cigarettes  . Smokeless tobacco: Never Used  . Alcohol Use: Yes     Comment: occa   OB History   Grav Para Term Preterm Abortions TAB SAB Ect Mult Living                 Review of Systems  Constitutional: Negative for fever, chills, diaphoresis, appetite change and fatigue.  HENT: Negative for mouth sores, sore throat and trouble swallowing.   Eyes: Negative for visual disturbance.  Respiratory: Negative for cough, chest tightness, shortness of breath and wheezing.   Cardiovascular: Negative for chest pain.  Gastrointestinal: Negative for nausea, vomiting, abdominal pain, diarrhea and abdominal distention.  Endocrine: Negative for polydipsia, polyphagia and polyuria.  Genitourinary: Positive for vaginal discharge and pelvic pain. Negative for dysuria, urgency, frequency, hematuria, flank pain and decreased urine volume.  Musculoskeletal:  Negative for gait problem.  Skin: Negative for color change, pallor and rash.  Neurological: Negative for dizziness, syncope, light-headedness and headaches.  Hematological: Does not bruise/bleed easily.  Psychiatric/Behavioral: Negative for behavioral problems and confusion.    Allergies  Peanut-containing drug products; Mustard seed; Shellfish allergy; Tomato; and Penicillins  Home Medications   Current Outpatient Rx  Name  Route  Sig  Dispense  Refill  . guaiFENesin (MUCINEX) 600 MG 12 hr tablet   Oral   Take 600 mg by mouth 2 (two) times daily as needed for congestion.         . miconazole (MONISTAT-3) KIT   Vaginal   Place 1 kit vaginally at bedtime.         . naproxen sodium (ANAPROX) 220 MG tablet   Oral   Take 220 mg by mouth 2 (two) times daily with a meal.         . oxyCODONE-acetaminophen (PERCOCET/ROXICET) 5-325 MG per tablet   Oral   Take 0.5 tablets by mouth every 4 (four) hours as needed for severe pain.         Marland Kitchen albuterol (PROVENTIL HFA;VENTOLIN HFA) 108 (90 BASE) MCG/ACT inhaler   Inhalation   Inhale 2 puffs into the lungs every 6 (six) hours as needed for wheezing or shortness of breath.         . doxycycline (VIBRAMYCIN) 100 MG capsule   Oral   Take 1 capsule (100 mg total) by mouth 2 (two) times daily.   20 capsule   0   .  fluconazole (DIFLUCAN) 200 MG tablet      Take 1 dose by mouth now. Take another dose on your last day the doxycycline (Ten days)   2 tablet   0   . oxyCODONE-acetaminophen (PERCOCET/ROXICET) 5-325 MG per tablet   Oral   Take 2 tablets by mouth every 4 (four) hours as needed.   6 tablet   0    BP 120/95  Pulse 81  Temp(Src) 97.3 F (36.3 C)  Resp 20  SpO2 100%  LMP 06/24/2013 Physical Exam  Constitutional: She is oriented to person, place, and time. She appears well-developed and well-nourished. No distress.  HENT:  Head: Normocephalic.  Eyes: Conjunctivae are normal. Pupils are equal, round, and  reactive to light. No scleral icterus.  Neck: Normal range of motion. Neck supple. No thyromegaly present.  Cardiovascular: Normal rate and regular rhythm.  Exam reveals no gallop and no friction rub.   No murmur heard. Pulmonary/Chest: Effort normal and breath sounds normal. No respiratory distress. She has no wheezes. She has no rales.  Abdominal: Soft. Bowel sounds are normal. She exhibits no distension. There is no tenderness. There is no rebound.    Genitourinary:    Musculoskeletal: Normal range of motion.  Neurological: She is alert and oriented to person, place, and time.  Skin: Skin is warm and dry. No rash noted.  Psychiatric: She has a normal mood and affect. Her behavior is normal.    ED Course  Procedures (including critical care time) Labs Review Labs Reviewed  WET PREP, GENITAL - Abnormal; Notable for the following:    Yeast Wet Prep HPF POC FEW (*)    WBC, Wet Prep HPF POC MANY (*)    All other components within normal limits  GC/CHLAMYDIA PROBE AMP  PREGNANCY, URINE  URINALYSIS, ROUTINE W REFLEX MICROSCOPIC   Imaging Review No results found.  EKG Interpretation   None       MDM   1. Pelvic inflammatory disease   2. Candida vaginitis    Her swab shows Candida. Clinically she's. Discharged in left adnexal tenderness. This is bilateral adnexal tenderness, worse on the left. It is consistent with pelvic inflammatory disease. Has a history of chlamydia several years ago. Treat her with Diflucan now. After completion of her ten day course of  Antibiotics, I  will treat her again.    Tanna Furry, MD 07/08/13 [phone]

## 2013-07-08 NOTE — Progress Notes (Signed)
[facility] provided pt with a list of primary care resources, highlighting [facility]. Also, provided pt with [facility] information.

## 2013-07-09 LAB — GC/CHLAMYDIA PROBE AMP
CT PROBE, AMP APTIMA: NEGATIVE
GC Probe RNA: NEGATIVE

## 2014-03-23 ENCOUNTER — Encounter (HOSPITAL_COMMUNITY): Payer: Self-pay | Admitting: Emergency Medicine

## 2014-03-23 ENCOUNTER — Emergency Department (HOSPITAL_COMMUNITY): Payer: Medicaid Other

## 2014-03-23 ENCOUNTER — Emergency Department (HOSPITAL_COMMUNITY): Payer: Self-pay

## 2014-03-23 ENCOUNTER — Emergency Department (HOSPITAL_COMMUNITY)
Admission: EM | Admit: 2014-03-23 | Discharge: 2014-03-24 | Disposition: A | Discharge status: Home / Self Care | Payer: Self-pay | Attending: Emergency Medicine | Admitting: Emergency Medicine

## 2014-03-23 DIAGNOSIS — Z880 Allergy status to penicillin: Secondary | ICD-10-CM | POA: Insufficient documentation

## 2014-03-23 DIAGNOSIS — Z791 Long term (current) use of non-steroidal anti-inflammatories (NSAID): Secondary | ICD-10-CM | POA: Insufficient documentation

## 2014-03-23 DIAGNOSIS — A5901 Trichomonal vulvovaginitis: Secondary | ICD-10-CM | POA: Insufficient documentation

## 2014-03-23 DIAGNOSIS — Z3202 Encounter for pregnancy test, result negative: Secondary | ICD-10-CM | POA: Insufficient documentation

## 2014-03-23 DIAGNOSIS — Z720 Tobacco use: Secondary | ICD-10-CM | POA: Insufficient documentation

## 2014-03-23 DIAGNOSIS — R102 Pelvic and perineal pain: Secondary | ICD-10-CM | POA: Insufficient documentation

## 2014-03-23 LAB — CBC WITH DIFFERENTIAL/PLATELET
BASOS PCT: 0 % (ref 0–1)
Basophils Absolute: 0.1 10*3/uL (ref 0.0–0.1)
EOS ABS: 0.4 10*3/uL (ref 0.0–0.7)
Eosinophils Relative: 3 % (ref 0–5)
HEMATOCRIT: 34.4 % — AB (ref 36.0–46.0)
HEMOGLOBIN: 11 g/dL — AB (ref 12.0–15.0)
LYMPHS ABS: 3.7 10*3/uL (ref 0.7–4.0)
Lymphocytes Relative: 26 % (ref 12–46)
MCH: 26.4 pg (ref 26.0–34.0)
MCHC: 32 g/dL (ref 30.0–36.0)
MCV: 82.7 fL (ref 78.0–100.0)
MONO ABS: 0.9 10*3/uL (ref 0.1–1.0)
MONOS PCT: 6 % (ref 3–12)
NEUTROS PCT: 65 % (ref 43–77)
Neutro Abs: 9.3 10*3/uL — ABNORMAL HIGH (ref 1.7–7.7)
Platelets: 546 10*3/uL — ABNORMAL HIGH (ref 150–400)
RBC: 4.16 MIL/uL (ref 3.87–5.11)
RDW: 15.1 % (ref 11.5–15.5)
WBC: 14.3 10*3/uL — ABNORMAL HIGH (ref 4.0–10.5)

## 2014-03-23 LAB — COMPREHENSIVE METABOLIC PANEL
ALK PHOS: 68 U/L (ref 39–117)
ALT: 16 U/L (ref 0–35)
ANION GAP: 13 (ref 5–15)
AST: 13 U/L (ref 0–37)
Albumin: 3.9 g/dL (ref 3.5–5.2)
BILIRUBIN TOTAL: 0.2 mg/dL — AB (ref 0.3–1.2)
BUN: 7 mg/dL (ref 6–23)
CHLORIDE: 99 meq/L (ref 96–112)
CO2: 23 mEq/L (ref 19–32)
CREATININE: 0.64 mg/dL (ref 0.50–1.10)
Calcium: 9.7 mg/dL (ref 8.4–10.5)
GFR calc non Af Amer: >90 mL/min (ref >90)
GLUCOSE: 89 mg/dL (ref 70–99)
POTASSIUM: 3.9 meq/L (ref 3.7–5.3)
Sodium: 135 mEq/L — ABNORMAL LOW (ref 137–147)
TOTAL PROTEIN: 8.3 g/dL (ref 6.0–8.3)

## 2014-03-23 LAB — URINALYSIS, ROUTINE W REFLEX MICROSCOPIC
BILIRUBIN URINE: NEGATIVE
Glucose, UA: NEGATIVE mg/dL
KETONES UR: NEGATIVE mg/dL
NITRITE: NEGATIVE
Protein, ur: NEGATIVE mg/dL
Specific Gravity, Urine: 1.014 (ref 1.005–1.030)
UROBILINOGEN UA: 0.2 mg/dL (ref 0.0–1.0)
pH: 5.5 (ref 5.0–8.0)

## 2014-03-23 LAB — WET PREP, GENITAL: Yeast Wet Prep HPF POC: NONE SEEN

## 2014-03-23 LAB — URINE MICROSCOPIC-ADD ON

## 2014-03-23 LAB — POC URINE PREG, ED: Preg Test, Ur: NEGATIVE

## 2014-03-23 LAB — LIPASE, BLOOD: Lipase: 38 U/L (ref 11–59)

## 2014-03-23 MED ORDER — SODIUM CHLORIDE 0.9 % IV BOLUS (SEPSIS)
1000.0000 mL | Freq: Once | INTRAVENOUS | Status: AC
  Administered 2014-03-23: 1000 mL via INTRAVENOUS

## 2014-03-23 MED ORDER — IOHEXOL 300 MG/ML  SOLN
100.0000 mL | Freq: Once | INTRAMUSCULAR | Status: AC | PRN
  Administered 2014-03-23: 100 mL via INTRAVENOUS

## 2014-03-23 MED ORDER — IOHEXOL 300 MG/ML  SOLN
50.0000 mL | Freq: Once | INTRAMUSCULAR | Status: AC | PRN
  Administered 2014-03-23: 50 mL via ORAL

## 2014-03-23 NOTE — ED Notes (Addendum)
Pt c/o lower abd pain on right side, states it started last week went away and came back last night worse. Pt state she thinks its her ovaries. C/o a small amount of white discharge

## 2014-03-23 NOTE — ED Notes (Signed)
Pt reports RLQ pain. Pt reports it started 2 weeks ago but eased off. Pain worsening yesterday. Pt reports same symptoms as previously had with cysts on ovaries. Pt also reports finishing monostat for yeast infection a week ago.

## 2014-03-23 NOTE — ED Provider Notes (Signed)
CSN: [id number]     Arrival date & time 03/23/14  1412 History   First MD Initiated Contact with Patient 03/23/14 1631     Chief Complaint  Patient presents with  . Abdominal Pain     (Consider location/radiation/quality/duration/timing/severity/associated sxs/prior Treatment) The history is provided by the patient. No language interpreter was used.  Brent BullaShaquail T Wargo is a 23 y/o F with no known significant PMHx presenting to the ED with right sided abdominal pain that has been ongoing since yesterday. Patient reported that the pain is localized to the right side of the abdomen - stated that she has been having an intermittent dull, aching pain that is worse with bending forward and lifting without radiation. Stated that she has been having vaginal discharge since one week ago - stated that it is a white color that is thick and stringy - reported that she took a Monostat last week and think that this did not cover it. Patient reported that she history of ovarian cysts - stated that these symptoms are similar to when her ovaries act up. Reported that she had a normal BM today - stated that she has been passing gas. Reported that she is sexually active - denied birth control and protection. Denied fever, nausea, vomiting, diarrhea, melena, hematochezia, dysuria, hematuria. LMP as per patient was approximately one week ago. PCP Dr. Daphine DeutscherMartin  History reviewed. No pertinent past medical history. History reviewed. No pertinent past surgical history. Family History  Problem Relation Age of Onset  . Hypertension Mother   . Lupus Sister    History  Substance Use Topics  . Smoking status: Current Every Day Smoker -- 0.50 packs/day    Types: Cigarettes  . Smokeless tobacco: Never Used  . Alcohol Use: Yes     Comment: occa   OB History   Grav Para Term Preterm Abortions TAB SAB Ect Mult Living                 Review of Systems  Constitutional: Negative for fever and chills.  Respiratory:  Negative for chest tightness and shortness of breath.   Cardiovascular: Negative for chest pain.  Gastrointestinal: Positive for abdominal pain. Negative for nausea, vomiting, diarrhea, constipation, blood in stool and anal bleeding.  Genitourinary: Positive for vaginal discharge and pelvic pain (Right). Negative for dysuria, decreased urine volume, vaginal bleeding and vaginal pain.      Allergies  Peanut-containing drug products; Mustard seed; Shellfish allergy; Tomato; and Penicillins  Home Medications   Prior to Admission medications   Medication Sig Start Date End Date Taking? Authorizing Provider  naproxen sodium (ANAPROX) 220 MG tablet Take 220 mg by mouth 2 (two) times daily with a meal.   Yes Historical Provider, MD  Tioconazole (MONISTAT 1 VA) Place 1 application vaginally once.   Yes Historical Provider, MD  metroNIDAZOLE (FLAGYL) 500 MG tablet Take 1 tablet (500 mg total) by mouth 2 (two) times daily. 03/24/14   Shevette Bess, PA-C   BP 91/72  Pulse 74  Temp(Src) 98.7 F (37.1 C) (Oral)  Resp 18  SpO2 100%  LMP 03/16/2014 Physical Exam  Nursing note and vitals reviewed. Constitutional: She is oriented to person, place, and time. She appears well-developed and well-nourished. No distress.  HENT:  Head: Normocephalic and atraumatic.  Eyes: Conjunctivae and EOM are normal. Right eye exhibits no discharge. Left eye exhibits no discharge.  Neck: Normal range of motion. Neck supple. No tracheal deviation present.  Cardiovascular: Normal rate, regular rhythm and normal  heart sounds.  Exam reveals no friction rub.   No murmur heard. Pulses:      Radial pulses are 2+ on the right side, and 2+ on the left side.       Dorsalis pedis pulses are 2+ on the right side, and 2+ on the left side.  Pulmonary/Chest: Effort normal and breath sounds normal. No respiratory distress. She has no wheezes. She has no rales.  Abdominal: Soft. Bowel sounds are normal. She exhibits no  distension. There is tenderness. There is no rebound and no guarding.  Obese Bowel sounds normoactive in all 4 quadrants Abdomen soft upon palpation Discomfort upon palpation to the right lower quadrant Negative peritoneal signs  Genitourinary:  Pelvic exam: Negative swelling, erythema, inflammation, lesions, sores, deformities identified to be external genitalia and vaginal canal. Negative blood in the vaginal folds. Mild thin white discharge with no odor identified. Cervix identified with erythema - mild strawberry appearance. Mild CMT. Negative left adnexal tenderness. Positive right adnexal tenderness. Exam chaperoned with tech.  Musculoskeletal: Normal range of motion.  Lymphadenopathy:    She has no cervical adenopathy.  Neurological: She is alert and oriented to person, place, and time. No cranial nerve deficit. She exhibits normal muscle tone. Coordination normal.  Skin: Skin is warm and dry. No rash noted. She is not diaphoretic. No erythema.  Psychiatric: She has a normal mood and affect. Her behavior is normal. Thought content normal.    ED Course  Procedures (including critical care time)  9:52 PM This provider with CT Tech, reported that the CT abdomen and with contrast would be fine - stated that even though the patient has an allergy to shellfish that it is okay for the patient to get contrast. Reported that he will call radiologist to get to the okay.   Results for orders placed during the hospital encounter of 03/23/14  WET PREP, GENITAL      Result Value Ref Range   Yeast Wet Prep HPF POC NONE SEEN  NONE SEEN   Trich, Wet Prep FEW (*) NONE SEEN   Clue Cells Wet Prep HPF POC RARE (*) NONE SEEN   WBC, Wet Prep HPF POC RARE (*) NONE SEEN  CBC WITH DIFFERENTIAL      Result Value Ref Range   WBC 14.3 (*) 4.0 - 10.5 K/uL   RBC 4.16  3.87 - 5.11 MIL/uL   Hemoglobin 11.0 (*) 12.0 - 15.0 g/dL   HCT 16.1 (*) 09.6 - 04.5 %   MCV 82.7  78.0 - 100.0 fL   MCH 26.4  26.0 - 34.0  pg   MCHC 32.0  30.0 - 36.0 g/dL   RDW 40.9  81.1 - 91.4 %   Platelets 546 (*) 150 - 400 K/uL   Neutrophils Relative % 65  43 - 77 %   Neutro Abs 9.3 (*) 1.7 - 7.7 K/uL   Lymphocytes Relative 26  12 - 46 %   Lymphs Abs 3.7  0.7 - 4.0 K/uL   Monocytes Relative 6  3 - 12 %   Monocytes Absolute 0.9  0.1 - 1.0 K/uL   Eosinophils Relative 3  0 - 5 %   Eosinophils Absolute 0.4  0.0 - 0.7 K/uL   Basophils Relative 0  0 - 1 %   Basophils Absolute 0.1  0.0 - 0.1 K/uL  COMPREHENSIVE METABOLIC PANEL      Result Value Ref Range   Sodium 135 (*) 137 - 147 mEq/L   Potassium 3.9  3.7 - 5.3 mEq/L   Chloride 99  96 - 112 mEq/L   CO2 23  19 - 32 mEq/L   Glucose, Bld 89  70 - 99 mg/dL   BUN 7  6 - 23 mg/dL   Creatinine, Ser 1.61  0.50 - 1.10 mg/dL   Calcium 9.7  8.4 - 09.6 mg/dL   Total Protein 8.3  6.0 - 8.3 g/dL   Albumin 3.9  3.5 - 5.2 g/dL   AST 13  0 - 37 U/L   ALT 16  0 - 35 U/L   Alkaline Phosphatase 68  39 - 117 U/L   Total Bilirubin 0.2 (*) 0.3 - 1.2 mg/dL   GFR calc non Af Amer >90  >90 mL/min   GFR calc Af Amer >90  >90 mL/min   Anion gap 13  5 - 15  URINALYSIS, ROUTINE W REFLEX MICROSCOPIC      Result Value Ref Range   Color, Urine YELLOW  YELLOW   APPearance CLOUDY (*) CLEAR   Specific Gravity, Urine 1.014  1.005 - 1.030   pH 5.5  5.0 - 8.0   Glucose, UA NEGATIVE  NEGATIVE mg/dL   Hgb urine dipstick TRACE (*) NEGATIVE   Bilirubin Urine NEGATIVE  NEGATIVE   Ketones, ur NEGATIVE  NEGATIVE mg/dL   Protein, ur NEGATIVE  NEGATIVE mg/dL   Urobilinogen, UA 0.2  0.0 - 1.0 mg/dL   Nitrite NEGATIVE  NEGATIVE   Leukocytes, UA MODERATE (*) NEGATIVE  LIPASE, BLOOD      Result Value Ref Range   Lipase 38  11 - 59 U/L  URINE MICROSCOPIC-ADD ON      Result Value Ref Range   Squamous Epithelial / LPF FEW (*) RARE   WBC, UA 3-6  <3 WBC/hpf   RBC / HPF 0-2  <3 RBC/hpf   Bacteria, UA FEW (*) RARE   Urine-Other MUCOUS PRESENT    POC URINE PREG, ED      Result Value Ref Range   Preg  Test, Ur NEGATIVE  NEGATIVE    Labs Review Labs Reviewed  WET PREP, GENITAL - Abnormal; Notable for the following:    Trich, Wet Prep FEW (*)    Clue Cells Wet Prep HPF POC RARE (*)    WBC, Wet Prep HPF POC RARE (*)    All other components within normal limits  CBC WITH DIFFERENTIAL - Abnormal; Notable for the following:    WBC 14.3 (*)    Hemoglobin 11.0 (*)    HCT 34.4 (*)    Platelets 546 (*)    Neutro Abs 9.3 (*)    All other components within normal limits  COMPREHENSIVE METABOLIC PANEL - Abnormal; Notable for the following:    Sodium 135 (*)    Total Bilirubin 0.2 (*)    All other components within normal limits  URINALYSIS, ROUTINE W REFLEX MICROSCOPIC - Abnormal; Notable for the following:    APPearance CLOUDY (*)    Hgb urine dipstick TRACE (*)    Leukocytes, UA MODERATE (*)    All other components within normal limits  URINE MICROSCOPIC-ADD ON - Abnormal; Notable for the following:    Squamous Epithelial / LPF FEW (*)    Bacteria, UA FEW (*)    All other components within normal limits  GC/CHLAMYDIA PROBE AMP  LIPASE, BLOOD  HIV ANTIBODY (ROUTINE TESTING)  POC URINE PREG, ED    Imaging Review US Transvaginal Non-ob  03/23/2014   CLINICAL DATA:  Right lower  quadrant pain for 2 weeks, worsening.  EXAM: TRANSABDOMINAL AND TRANSVAGINAL ULTRASOUND OF PELVIS  DOPPLER ULTRASOUND OF OVARIES  TECHNIQUE: Both transabdominal and transvaginal ultrasound examinations of the pelvis were performed. Transabdominal technique was performed for global imaging of the pelvis including uterus, ovaries, adnexal regions, and pelvic cul-de-sac.  It was necessary to proceed with endovaginal exam following the transabdominal exam to visualize the endometrium and adnexa. Color and duplex Doppler ultrasound was utilized to evaluate blood flow to the ovaries.  COMPARISON:  None.  FINDINGS: Uterus  Measurements: 7.0 x 4.0 x 4.3 cm. No fibroids or other mass visualized.  Endometrium  Thickness: 0.2  cm. In the superior most aspect of the endometrial canal versus submucosa, there is a hyperechoic structure measuring 0.9 cm in diameter.  Right ovary  Measurements: 3.7 x 2.1 x 2.6 cm. Normal appearance/no adnexal mass.  Left ovary  Measurements: 4.2 x 3.1 x 3.8 cm. Normal appearance/no adnexal mass. Several small follicles are identified. One of these measuring 1.4 cm has some internal echoes within it possibly secondary to hemorrhage. No worrisome lesion is identified.  Pulsed Doppler evaluation of both ovaries demonstrates normal low-resistance arterial and venous waveforms.  Other findings  No free fluid.  IMPRESSION: Negative for ovarian torsion or other acute abnormality.  Hyperechoic structure in the superior endometrial canal versus submucosa could be an endometrial polyp or possibly a submucosal fibroid. Gynecologic follow-up is recommended. Sonohysterogram could be used for further evaluation.   Electronically Signed   By: Drusilla Kanner M.D.   On: 03/23/2014 20:27   US Pelvis Complete  03/23/2014   CLINICAL DATA:  Right lower quadrant pain for 2 weeks, worsening.  EXAM: TRANSABDOMINAL AND TRANSVAGINAL ULTRASOUND OF PELVIS  DOPPLER ULTRASOUND OF OVARIES  TECHNIQUE: Both transabdominal and transvaginal ultrasound examinations of the pelvis were performed. Transabdominal technique was performed for global imaging of the pelvis including uterus, ovaries, adnexal regions, and pelvic cul-de-sac.  It was necessary to proceed with endovaginal exam following the transabdominal exam to visualize the endometrium and adnexa. Color and duplex Doppler ultrasound was utilized to evaluate blood flow to the ovaries.  COMPARISON:  None.  FINDINGS: Uterus  Measurements: 7.0 x 4.0 x 4.3 cm. No fibroids or other mass visualized.  Endometrium  Thickness: 0.2 cm. In the superior most aspect of the endometrial canal versus submucosa, there is a hyperechoic structure measuring 0.9 cm in diameter.  Right ovary   Measurements: 3.7 x 2.1 x 2.6 cm. Normal appearance/no adnexal mass.  Left ovary  Measurements: 4.2 x 3.1 x 3.8 cm. Normal appearance/no adnexal mass. Several small follicles are identified. One of these measuring 1.4 cm has some internal echoes within it possibly secondary to hemorrhage. No worrisome lesion is identified.  Pulsed Doppler evaluation of both ovaries demonstrates normal low-resistance arterial and venous waveforms.  Other findings  No free fluid.  IMPRESSION: Negative for ovarian torsion or other acute abnormality.  Hyperechoic structure in the superior endometrial canal versus submucosa could be an endometrial polyp or possibly a submucosal fibroid. Gynecologic follow-up is recommended. Sonohysterogram could be used for further evaluation.   Electronically Signed   By: Drusilla Kanner M.D.   On: 03/23/2014 20:27   Ct Abdomen Pelvis W Contrast  03/23/2014   CLINICAL DATA:  Right lower quadrant abdominal pain. Patient again 2 weeks ago no worsened yesterday. Initial encounter.  EXAM: CT ABDOMEN AND PELVIS WITH CONTRAST  TECHNIQUE: Multidetector CT imaging of the abdomen and pelvis was performed using the standard protocol following  bolus administration of intravenous contrast.  CONTRAST:  50mL OMNIPAQUE IOHEXOL 300 MG/ML SOLN, 100mL OMNIPAQUE IOHEXOL 300 MG/ML SOLN  COMPARISON:  Pelvic ultrasound -03/23/2014  FINDINGS: Normal hepatic contour. There is a minimal amount of focal fatty infiltration adjacent to the fissure for ligamentum teres. No discrete hepatic lesions. Normal appearance of the gallbladder. No radiopaque gallstones. No intra or extrahepatic biliary duct dilatation. No ascites.  Normal appearance of the bilateral kidneys. No definite renal stones on this postcontrast examination. No discrete renal lesions urinary obstruction. Normal appearance of the bilateral adrenal glands, pancreas and spleen.  Ingested enteric contrast extends to the level of the hepatic flexure of the colon.  The bowel is normal in course and caliber without wall thickening or evidence of obstruction. Normal appearance of the appendix. No pneumoperitoneum, pneumatosis or portal venous gas.  Normal caliber of the abdominal aorta. The major branch vessels of the abdominal aorta appear widely patent on this non CTA examination. No retroperitoneal, mesenteric, pelvic or inguinal lymphadenopathy.  There is a minimal amount of physiologic fluid within the endometrial canal. Note is made of an approximately 1.8 x 1.9 cm hypo attenuating (approximately 13 Hounsfield unit) left-sided adnexal cyst (image 69, series 2). No discrete right-sided adnexal lesions. No free fluid in the pelvic cul-de-sac. Normal appearance of the urinary bladder given degree distention.  Limited visualization of the lower thorax is negative for focal airspace opacity or pleural effusion.  Normal heart size.  No pericardial effusion.  No acute or aggressive osseus abnormalities.  Regional soft tissues appear normal.  IMPRESSION: 1. No explanation for patient's right lower quadrant abdominal pain. Specifically, no evidence of enteric or urinary obstruction. Normal appearance of the appendix. 2. Trace amount of physiologic fluid within the endometrial canal with an approximately 1.9 cm presumably physiologic left-sided adnexal cyst. Please refer to dedicated pelvic ultrasound performed earlier same day.   Electronically Signed   By: Simonne ComeJohn  Watts M.D.   On: 03/23/2014 23:15   [place] Art/ven Flow Abd Pelv Doppler  03/23/2014   CLINICAL DATA:  Right lower quadrant pain for 2 weeks, worsening.  EXAM: TRANSABDOMINAL AND TRANSVAGINAL ULTRASOUND OF PELVIS  DOPPLER ULTRASOUND OF OVARIES  TECHNIQUE: Both transabdominal and transvaginal ultrasound examinations of the pelvis were performed. Transabdominal technique was performed for global imaging of the pelvis including uterus, ovaries, adnexal regions, and pelvic cul-de-sac.  It was necessary to proceed with  endovaginal exam following the transabdominal exam to visualize the endometrium and adnexa. Color and duplex Doppler ultrasound was utilized to evaluate blood flow to the ovaries.  COMPARISON:  None.  FINDINGS: Uterus  Measurements: 7.0 x 4.0 x 4.3 cm. No fibroids or other mass visualized.  Endometrium  Thickness: 0.2 cm. In the superior most aspect of the endometrial canal versus submucosa, there is a hyperechoic structure measuring 0.9 cm in diameter.  Right ovary  Measurements: 3.7 x 2.1 x 2.6 cm. Normal appearance/no adnexal mass.  Left ovary  Measurements: 4.2 x 3.1 x 3.8 cm. Normal appearance/no adnexal mass. Several small follicles are identified. One of these measuring 1.4 cm has some internal echoes within it possibly secondary to hemorrhage. No worrisome lesion is identified.  Pulsed Doppler evaluation of both ovaries demonstrates normal low-resistance arterial and venous waveforms.  Other findings  No free fluid.  IMPRESSION: Negative for ovarian torsion or other acute abnormality.  Hyperechoic structure in the superior endometrial canal versus submucosa could be an endometrial polyp or possibly a submucosal fibroid. Gynecologic follow-up is recommended. Sonohysterogram could be  used for further evaluation.   Electronically Signed   By: Drusilla Kanner M.D.   On: 03/23/2014 20:27     EKG Interpretation None      MDM   Final diagnoses:  Pelvic pain in female  Trichomonas vaginitis    Medications  sodium chloride 0.9 % bolus 1,000 mL (1,000 mLs Intravenous New Bag/Given 03/23/14 1839)  iohexol (OMNIPAQUE) 300 MG/ML solution 100 mL (100 mLs Intravenous Contrast Given 03/23/14 2301)  iohexol (OMNIPAQUE) 300 MG/ML solution 50 mL (50 mLs Oral Contrast Given 03/23/14 2145)    Filed Vitals:   03/23/14 1737 03/23/14 1834 03/23/14 2127 03/23/14 2315  BP: 138/77 135/83 124/80 91/72  Pulse: 84 80 83 74  Temp:      TempSrc:      Resp: 18 18 18 18   SpO2: 100% 100% 99% 100%   This provider  reviewed patient's chart. Back on 07/06/2013 patient had history of pelvic inflammatory disease. CBC noted elevated white blood cell count of 14.3. CMP unremarkable. Lipase negative elevation. Urine pregnancy negative. Urinalysis noted trace hemoglobin with moderate leukocytes with white blood cell count of 3-6. Wet prep noted few Trichomonas. Pelvic ultrasound negative for ovarian torsion or other acute abnormality. Hyper echoic structure in the superior endometrial canal versus submucosal could be endometrial polyp or possibly a submucosal fibroid. Negative findings of tubo-ovarian abscess. CT abdomen pelvis with contrast unremarkable-negative findings for appendicitis. Trace amount of physiological fluid within the endometrial canal presumably from left adnexal cyst. GC Chlamydia probe pending. Urine culture pending.  Doubt ectopic pregnancy. Negative findings of tubo-ovarian abscess. Negative findings of appendicitis. Negative findings of ovarian torsion. Negative findings of acute abdominal processes. Definitive etiology of lower dominant pain secondary to STD-trichomonal infection. Patient stable, afebrile. Patient not septic appearing. Patient able to tolerate fluids PO without difficulty - negative episodes of emesis while in the ED setting. Patient stable for discharge. Discharged patient with flagyl. Discussed wit patient to follow-up with [facility]. Discussed with patient importance of having all partner(s) assessed - discussed with patient to avoid any sexual activity. Discussed with patient importance of pap smears and monitoring for HPV. Discussed with patient to closely monitor symptoms and if symptoms are to worsen or change to report back to the ED - strict return instructions given.  Patient agreed to plan of care, understood, all questions answered.   Raymon Mutton, PA-C 03/24/14 0111  Raymon Mutton, PA-C 03/24/14 [phone]

## 2014-03-24 LAB — GC/CHLAMYDIA PROBE AMP
CT PROBE, AMP APTIMA: NEGATIVE
GC PROBE AMP APTIMA: NEGATIVE

## 2014-03-24 LAB — HIV ANTIBODY (ROUTINE TESTING W REFLEX): HIV: NONREACTIVE

## 2014-03-24 MED ORDER — METRONIDAZOLE 500 MG PO TABS: 500.0000 mg | ORAL_TABLET | Freq: Two times a day (BID) | ORAL | Status: DC

## 2014-03-24 NOTE — Discharge Instructions (Signed)
Please call your doctor for a followup appointment within 24-48 hours. When you talk to your doctor please let them know that you were seen in the emergency department and have them acquire all of your records so that they can discuss the findings with you and formulate a treatment plan to fully care for your new and ongoing problems. Please call and set-up an appointment with your primary care provider Please rest and stay hydrated Please call and set-up an appointment [facility]  Please follow-up regarding polyp noted on ultrasound of the pelvis Please have all partner(s) checked Please avoid sexual activity until re-assessed Please call and set-up an appointment for Pap smear to be performed Please continue to monitor symptoms closely and if symptoms are to worsen or change (fever greater than 101, chills, sweating, nausea, vomiting, chest pain, shortness of breathe, difficulty breathing, weakness, numbness, tingling, worsening or changes to pain pattern, changes to vaginal discharge, abnormal bleeding, blood in the stools, black tarry stools) please report back to the Emergency Department immediately.   Trichomoniasis Trichomoniasis is an infection caused by an organism called Trichomonas. The infection can affect both women and men. In women, the outer female genitalia and the vagina are affected. In men, the penis is mainly affected, but the prostate and other reproductive organs can also be involved. Trichomoniasis is a sexually transmitted infection (STI) and is most often passed to another person through sexual contact.  RISK FACTORS  Having unprotected sexual intercourse.  Having sexual intercourse with an infected partner. SIGNS AND SYMPTOMS  Symptoms of trichomoniasis in women include:  Abnormal gray-green frothy vaginal discharge.  Itching and irritation of the vagina.  Itching and irritation of the area outside the vagina. Symptoms of trichomoniasis in men  include:   Penile discharge with or without pain.  Pain during urination. This results from inflammation of the urethra. DIAGNOSIS  Trichomoniasis may be found during a Pap test or physical exam. Your health care provider may use one of the following methods to help diagnose this infection:  Examining vaginal discharge under a microscope. For men, urethral discharge would be examined.  Testing the pH of the vagina with a test tape.  Using a vaginal swab test that checks for the Trichomonas organism. A test is available that provides results within a few minutes.  Doing a culture test for the organism. This is not usually needed. TREATMENT   You may be given medicine to fight the infection. Women should inform their health care provider if they could be or are pregnant. Some medicines used to treat the infection should not be taken during pregnancy.  Your health care provider may recommend over-the-counter medicines or creams to decrease itching or irritation.  Your sexual partner will need to be treated if infected. HOME CARE INSTRUCTIONS   Take medicines only as directed by your health care provider.  Take over-the-counter medicine for itching or irritation as directed by your health care provider.  Do not have sexual intercourse while you have the infection.  Women should not douche or wear tampons while they have the infection.  Discuss your infection with your partner. Your partner may have gotten the infection from you, or you may have gotten it from your partner.  Have your sex partner get examined and treated if necessary.  Practice safe, informed, and protected sex.  See your health care provider for other STI testing. SEEK MEDICAL CARE IF:   You still have symptoms after you finish your  medicine.  You develop abdominal pain.  You have pain when you urinate.  You have bleeding after sexual intercourse.  You develop a rash.  Your medicine makes you sick or  makes you throw up (vomit). MAKE SURE YOU:  Understand these instructions.  Will watch your condition.  Will get help right away if you are not doing well or get worse. Document Released: 11/22/2000 Document Revised: 10/13/2013 Document Reviewed: 03/10/2013 [facility]Exit[facility] Patient Information 2015 [place], [place]. This information is not intended to replace advice given to you by your health care provider. Make sure you discuss any questions you have with your health care provider.

## 2014-03-28 NOTE — ED Provider Notes (Signed)
Medical screening examination/treatment/procedure(s) were performed by non-physician practitioner and as supervising physician I was immediately available for consultation/collaboration.   EKG Interpretation None       Prescott Truex M Tesia Lybrand, MD 03/28/14 2219 

## 2015-07-10 ENCOUNTER — Emergency Department (HOSPITAL_COMMUNITY)
Admission: EM | Admit: 2015-07-10 | Discharge: 2015-07-10 | Disposition: A | Discharge status: Home / Self Care | Payer: Medicaid Other | Attending: Emergency Medicine | Admitting: Emergency Medicine

## 2015-07-10 ENCOUNTER — Encounter (HOSPITAL_COMMUNITY): Payer: Self-pay | Admitting: *Deleted

## 2015-07-10 DIAGNOSIS — Z791 Long term (current) use of non-steroidal anti-inflammatories (NSAID): Secondary | ICD-10-CM | POA: Insufficient documentation

## 2015-07-10 DIAGNOSIS — H109 Unspecified conjunctivitis: Secondary | ICD-10-CM | POA: Insufficient documentation

## 2015-07-10 DIAGNOSIS — F17210 Nicotine dependence, cigarettes, uncomplicated: Secondary | ICD-10-CM | POA: Insufficient documentation

## 2015-07-10 DIAGNOSIS — Z792 Long term (current) use of antibiotics: Secondary | ICD-10-CM | POA: Insufficient documentation

## 2015-07-10 DIAGNOSIS — Z880 Allergy status to penicillin: Secondary | ICD-10-CM | POA: Insufficient documentation

## 2015-07-10 MED ORDER — POLYMYXIN B-TRIMETHOPRIM 10000-0.1 UNIT/ML-% OP SOLN: 1.0000 [drp] | OPHTHALMIC | Status: DC

## 2015-07-10 NOTE — Discharge Instructions (Signed)
Conjunctivitis is commonly called "pink eye." Conjunctivitis can be caused by bacterial or viral infection, allergies, or injuries. There is usually redness of the lining of the eye, itching, discomfort, and sometimes discharge. Pink eye is very contagious and spreads by direct contact. Try to avoid rubbing eyes and wash hands often. ° °You may be given antibiotic eyedrops as part of your treatment. Before using your eye medicine, remove all drainage from the eye by washing gently with warm water and cotton balls. Continue to use the medication until you have awakened 2 mornings in a row without discharge from the eye. Do not rub your eye. This increases the irritation and helps spread infection. Use separate towels from other household members. Wash your hands with soap and water before and after touching your eyes. Use cold compresses to reduce pain and sunglasses to relieve irritation from light. Do not wear contact lenses or wear eye makeup until the infection is gone.  ° °SEEK MEDICAL CARE IF:  °Your symptoms are not better after 3 days of treatment.  °You have increased pain or trouble seeing.  °The outer eyelids become very red or swollen.  °You develop double vision or your vision becomes blurred or worsens in any way.  °You have trouble moving your eyes.  °You develop a severe headache, severe neck pain, or neck stiffness.  °You develop repeated vomiting.  °You have a fever or persistent symptoms for more than 72 hours.  °You have a fever and your symptoms suddenly get worse.  °

## 2015-07-10 NOTE — ED Notes (Signed)
Pt reports R eye pain, swelling and drainage since yesterday.

## 2015-07-10 NOTE — ED Provider Notes (Signed)
CSN: [id number]     Arrival date & time 07/10/15  1650 History  By signing my name below, I, Murriel Hopper, attest that this documentation has been prepared under the direction and in the presence of [facility], PA-C.  Electronically Signed: Murriel Hopper, ED Scribe. 07/10/2015. 5:54 PM.    Chief Complaint  Patient presents with  . Eye Pain     Patient is a 25 y.o. female presenting with eye pain. The history is provided by the patient. No language interpreter was used.  Eye Pain    HPI Comments: Monica Douglas is a 25 y.o. female who presents to the Emergency Department complaining of constant improving right eye pain that has been present since yesterday. Pt reports that it began while she was at work, and states that she woke up this morning with her eye swollen shut and it was swollen. Pt states that the swelling has decreased since this morning. Pt also reports associated intermittent discharge from the eye that began this morning. Pt denies doing anything to treat her symptoms prior to coming to ED. Pt states her boyfriend has the same symptoms. Pt denies cough, congestion, sore throat, ear pain.    History reviewed. No pertinent past medical history. History reviewed. No pertinent past surgical history. Family History  Problem Relation Age of Onset  . Hypertension Mother   . Lupus Sister    Social History  Substance Use Topics  . Smoking status: Current Every Day Smoker -- 0.25 packs/day    Types: Cigarettes  . Smokeless tobacco: Never Used  . Alcohol Use: Yes     Comment: occa   OB History    No data available     Review of Systems  Constitutional: Negative for fever.  HENT: Negative for congestion, ear pain and sore throat.   Eyes: Positive for pain, discharge and redness. Negative for visual disturbance.  Respiratory: Negative for cough.       Allergies  Peanut-containing drug products; Mustard seed; Shellfish allergy; Tomato; and Penicillins  Home  Medications   Prior to Admission medications   Medication Sig Start Date End Date Taking? Authorizing Provider  metroNIDAZOLE (FLAGYL) 500 MG tablet Take 1 tablet (500 mg total) by mouth 2 (two) times daily. 03/24/14   Marissa Sciacca, PA-C  naproxen sodium (ANAPROX) 220 MG tablet Take 220 mg by mouth 2 (two) times daily with a meal.    Historical Provider, MD  Tioconazole (MONISTAT 1 VA) Place 1 application vaginally once.    Historical Provider, MD  trimethoprim-polymyxin b (POLYTRIM) ophthalmic solution Place 1 drop into the right eye every 4 (four) hours. 07/10/15   Tynslee Bowlds Pilcher Khair Chasteen, PA-C   BP 159/95 mmHg  Pulse 85  Temp(Src) 98.4 F (36.9 C) (Oral)  Resp 17  SpO2 100%  LMP 07/03/2015 Physical Exam  Constitutional: She is oriented to person, place, and time. She appears well-developed and well-nourished.  HENT:  Head: Normocephalic and atraumatic.  Right Ear: Tympanic membrane, external ear and ear canal normal.  Left Ear: Tympanic membrane, external ear and ear canal normal.  Mouth/Throat: No posterior oropharyngeal edema.  OP with erythema, no exudates.  Eyes: EOM are normal. Pupils are equal, round, and reactive to light. Right eye exhibits discharge. Left eye exhibits no discharge. Right conjunctiva is injected. Left conjunctiva is not injected.  Cardiovascular: Normal rate.   Pulmonary/Chest: Effort normal.  Abdominal: She exhibits no distension.  Lymphadenopathy:    She has no cervical adenopathy.  Neurological: She is  alert and oriented to person, place, and time.  Skin: Skin is warm and dry.  Psychiatric: She has a normal mood and affect.  Nursing note and vitals reviewed.   ED Course  Procedures (including critical care time)  DIAGNOSTIC STUDIES: Oxygen Saturation is 100% on room air, normal by my interpretation.    COORDINATION OF CARE: 5:40 PM Discussed treatment plan with pt at bedside and pt agreed to plan.   Labs Review Labs Reviewed - No data to  display  Imaging Review No results found. I have personally reviewed and evaluated these images and lab results as part of my medical decision-making.   EKG Interpretation None      MDM   Final diagnoses:  Conjunctivitis of right eye   Monica Douglas presents with right eye swelling/pain/discharge c/w bacterial conjunctivitis. Boyfriend with same sxs. No evidence of corneal abrasions, entrapment, consensual photophobia, or herpes keratitis. Presentation not concerning for iritis, or corneal abrasions.  Pt discharged with Polyrim.  Personal hygiene and frequent handwashing discussed.  Patient advised to follow up with ophthalmologist if symptoms persist or worsen. Return precautions discussed.  Patient verbalizes understanding and is agreeable with discharge.  I personally performed the services described in this documentation, which was scribed in my presence. The recorded information has been reviewed and is accurate.  [facility] Jaidence Geisler, PA-C 07/10/15 1840  Cathren Laine, MD 07/12/15 [phone]

## 2015-07-13 ENCOUNTER — Encounter (HOSPITAL_COMMUNITY): Payer: Self-pay

## 2015-07-13 ENCOUNTER — Emergency Department (HOSPITAL_COMMUNITY)
Admission: EM | Admit: 2015-07-13 | Discharge: 2015-07-13 | Disposition: A | Discharge status: Home / Self Care | Payer: Self-pay | Attending: Emergency Medicine | Admitting: Emergency Medicine

## 2015-07-13 ENCOUNTER — Emergency Department (HOSPITAL_COMMUNITY): Payer: Medicaid Other

## 2015-07-13 DIAGNOSIS — L03213 Periorbital cellulitis: Secondary | ICD-10-CM

## 2015-07-13 DIAGNOSIS — Z880 Allergy status to penicillin: Secondary | ICD-10-CM | POA: Insufficient documentation

## 2015-07-13 DIAGNOSIS — H05011 Cellulitis of right orbit: Secondary | ICD-10-CM | POA: Insufficient documentation

## 2015-07-13 DIAGNOSIS — Z3202 Encounter for pregnancy test, result negative: Secondary | ICD-10-CM | POA: Insufficient documentation

## 2015-07-13 DIAGNOSIS — Z792 Long term (current) use of antibiotics: Secondary | ICD-10-CM | POA: Insufficient documentation

## 2015-07-13 DIAGNOSIS — Z791 Long term (current) use of non-steroidal anti-inflammatories (NSAID): Secondary | ICD-10-CM | POA: Insufficient documentation

## 2015-07-13 DIAGNOSIS — F17210 Nicotine dependence, cigarettes, uncomplicated: Secondary | ICD-10-CM | POA: Insufficient documentation

## 2015-07-13 LAB — I-STAT CREATININE, ED: Creatinine, Ser: 0.7 mg/dL (ref 0.44–1.00)

## 2015-07-13 LAB — I-STAT BETA HCG BLOOD, ED (MC, WL, AP ONLY): I-stat hCG, quantitative: <5 m[IU]/mL (ref <5)

## 2015-07-13 MED ORDER — IOHEXOL 300 MG/ML  SOLN
75.0000 mL | Freq: Once | INTRAMUSCULAR | Status: AC | PRN
Start: 2015-07-13 — End: 2015-07-13
  Administered 2015-07-13: 75 mL via INTRAVENOUS

## 2015-07-13 MED ORDER — SULFAMETHOXAZOLE-TRIMETHOPRIM 800-160 MG PO TABS: 2.0000 | ORAL_TABLET | Freq: Two times a day (BID) | ORAL | Status: AC

## 2015-07-13 MED ORDER — CEPHALEXIN 500 MG PO CAPS: 500.0000 mg | ORAL_CAPSULE | Freq: Four times a day (QID) | ORAL | Status: DC

## 2015-07-13 MED ORDER — HYDROCODONE-ACETAMINOPHEN 5-325 MG PO TABS: ORAL_TABLET | ORAL | Status: DC

## 2015-07-13 MED ORDER — MORPHINE SULFATE (PF) 4 MG/ML IV SOLN
4.0000 mg | Freq: Once | INTRAVENOUS | Status: AC
  Administered 2015-07-13: 4 mg via INTRAVENOUS
  Filled 2015-07-13: qty 1

## 2015-07-13 MED ORDER — FLUORESCEIN SODIUM 1 MG OP STRP
1.0000 | ORAL_STRIP | Freq: Once | OPHTHALMIC | Status: AC
  Administered 2015-07-13: 1 via OPHTHALMIC
  Filled 2015-07-13: qty 1

## 2015-07-13 MED ORDER — CEPHALEXIN 500 MG PO CAPS
500.0000 mg | ORAL_CAPSULE | Freq: Once | ORAL | Status: AC
  Administered 2015-07-13: 500 mg via ORAL
  Filled 2015-07-13: qty 1

## 2015-07-13 MED ORDER — HYDROCODONE-ACETAMINOPHEN 5-325 MG PO TABS
1.0000 | ORAL_TABLET | Freq: Once | ORAL | Status: AC
  Administered 2015-07-13: 1 via ORAL
  Filled 2015-07-13: qty 1

## 2015-07-13 MED ORDER — SULFAMETHOXAZOLE-TRIMETHOPRIM 800-160 MG PO TABS
2.0000 | ORAL_TABLET | Freq: Once | ORAL | Status: AC
Start: 2015-07-13 — End: 2015-07-13
  Administered 2015-07-13: 2 via ORAL
  Filled 2015-07-13: qty 2

## 2015-07-13 MED ORDER — TETRACAINE HCL 0.5 % OP SOLN
2.0000 [drp] | Freq: Once | OPHTHALMIC | Status: AC
  Administered 2015-07-13: 2 [drp] via OPHTHALMIC
  Filled 2015-07-13: qty 4

## 2015-07-13 NOTE — Discharge Instructions (Signed)
Take vicodin for breakthrough pain, do not drink alcohol, drive, care for children or do other critical tasks while taking vicodin.  Do not hesitate to return to the emergency room for any new, worsening or concerning symptoms.  Take your antibiotics as directed and to completion. You should never have any leftover antibiotics! Push fluids and stay well hydrated.   Any antibiotic use can reduce the efficacy of hormonal birth control. Please use back up method of contraception.   Please obtain primary care using resource guide below. Let them know that you were seen in the emergency room and that they will need to obtain records for further outpatient management.

## 2015-07-13 NOTE — ED Notes (Signed)
Patient transported to CT 

## 2015-07-13 NOTE — ED Notes (Signed)
Patient was treated 3 days ago for pink eye. Patient returns with c/o increased swelling of the right eye and face. Patient also c/o soreness and tenderness of the area.

## 2015-07-13 NOTE — ED Provider Notes (Signed)
CSN: [id number]     Arrival date & time 07/13/15  1410 History   First MD Initiated Contact with Patient 07/13/15 1441     Chief Complaint  Patient presents with  . Facial Swelling     (Consider location/radiation/quality/duration/timing/severity/associated sxs/prior Treatment) HPI   25 year old female presents complaining of facial swelling. Patient reports 3 days ago she was seen in the ED with complaints of right eye pain and injected conjunctiva subsequently diagnosed with having bacterial conjunctivitis. Her boyfriend was having similar complaint. Patient was discharged with Polytrim and instruction to follow-up with an eye specialist. Patient states that the medication has not provided relief she has noticed increasing swelling to her upper and lower eyelid with soreness on the right side of her eyes. States that her eye became much more teary and decided to come to the ED for further evaluation. Her boyfriend symptom seems to be improving but not fully resolved. Patient denies any double vision or limited eye movement. She denies any injury. She does not wear contact lens. No complaints of additional URI symptoms. No history of sickle cell.  History reviewed. No pertinent past medical history. History reviewed. No pertinent past surgical history. Family History  Problem Relation Age of Onset  . Hypertension Mother   . Lupus Sister    Social History  Substance Use Topics  . Smoking status: Current Every Day Smoker -- 0.25 packs/day    Types: Cigarettes  . Smokeless tobacco: Never Used  . Alcohol Use: Yes     Comment: occa   OB History    No data available     Review of Systems  Constitutional: Negative for fever.  HENT: Negative for congestion, ear pain and postnasal drip.   Eyes: Positive for photophobia, pain, discharge and redness. Negative for itching.  Skin: Negative for wound.      Allergies  Peanut-containing drug products; Mustard seed; Shellfish allergy;  Tomato; and Penicillins  Home Medications   Prior to Admission medications   Medication Sig Start Date End Date Taking? Authorizing Provider  metroNIDAZOLE (FLAGYL) 500 MG tablet Take 1 tablet (500 mg total) by mouth 2 (two) times daily. 03/24/14   Marissa Sciacca, PA-C  naproxen sodium (ANAPROX) 220 MG tablet Take 220 mg by mouth 2 (two) times daily with a meal.    Historical Provider, MD  Tioconazole (MONISTAT 1 VA) Place 1 application vaginally once.    Historical Provider, MD  trimethoprim-polymyxin b (POLYTRIM) ophthalmic solution Place 1 drop into the right eye every 4 (four) hours. 07/10/15   Jaime Pilcher Ward, PA-C   BP 139/96 mmHg  Pulse 88  Temp(Src) 98.1 F (36.7 C) (Oral)  Resp 18  Ht  (1.651 m)  Wt 97.523 kg  BMI 35.78 kg/m2  SpO2 99%  LMP 07/03/2015 Physical Exam  Constitutional: She appears well-developed and well-nourished. No distress.  African-American female nontoxic in appearance  HENT:  Head: Atraumatic.  Eyes: EOM are normal. Pupils are equal, round, and reactive to light. Lids are everted and swept, no foreign bodies found. Right eye exhibits chemosis. Right eye exhibits no discharge, no exudate and no hordeolum. No foreign body present in the right eye. Left eye exhibits no chemosis, no discharge and no exudate. No foreign body present in the left eye. Right conjunctiva is injected. Right conjunctiva has no hemorrhage. Left conjunctiva is not injected. Left conjunctiva has no hemorrhage. No scleral icterus.  Slit lamp exam:      The right eye shows no corneal abrasion,  no corneal flare, no corneal ulcer, no foreign body, no hyphema, no hypopyon, no fluorescein uptake and no anterior chamber bulge.       The left eye shows no fluorescein uptake and no anterior chamber bulge.  Right upper and lower eyelid is moderately edematous and erythematous, tender to palpation. Decrease lateral eye movement due to pain. Extraocular movements intact. R eye with chemosis  and injected conjunctiva that is limbic sparing.   Neck: Neck supple.  Neurological: She is alert.  Skin: No rash noted.  Psychiatric: She has a normal mood and affect.  Nursing note and vitals reviewed.   ED Course  Procedures (including critical care time) Labs Review Labs Reviewed - No data to display  Imaging Review No results found. I have personally reviewed and evaluated these images and lab results as part of my medical decision-making.   EKG Interpretation None      MDM   Final diagnoses:  None   BP 139/96 mmHg  Pulse 88  Temp(Src) 98.1 F (36.7 C) (Oral)  Resp 18  Ht  (1.651 m)  Wt 97.523 kg  BMI 35.78 kg/m2  SpO2 99%  LMP 07/03/2015   3:10 PM Patient complaining of worsening eye pain which now radiates to the back of her head with increasing pain with eye movement and increasing swelling to her upper and lower eyelid. She reportedly has been using her Polytrim without any relief. She now complaining of pain radiates towards the back of her head and to the right temporal region and she is overly concerned, requesting for advanced imaging. She is not having any fever. Denies any vision changes. On exam she does have a moderately edematous upper and lower eyelid with and injected however. I performed intraocular pressure and her left eye is 15, right eyes is 23 (likely not an accurate measurement as patient cannot tolerate the procedure). Fluoresceins stain shows no evidence of fluoroscein uptake to suggest corneal ulcers. or corneal abrasion, negative Seidel sign.  Pain medication given, will obtain orbital CT to r/o orbital cellulitis since pt was having pain with eye movement.  Care discussed with oncoming provider who will monitor pt and dispo after reassessment.  She will benefit from f/u with ophthalmologist for further care.    Fayrene Helper, PA-C 07/14/15 1055  Mancel Bale, MD 07/14/15 [phone]

## 2015-07-13 NOTE — ED Notes (Signed)
PA at bedside.

## 2015-07-13 NOTE — ED Provider Notes (Signed)
PROGRESS NOTE                                                                                                                 This is a sign-out from PA [place] at shift change: Monica Douglas is a 25 y.o. female presenting with increasing swelling and discomfort to right eyelid. Patient was seen several days ago, started on Polytrim for conjunctivitis, she's been compliant with her antibiotic drops but the swelling to the eye has increased, there is some concern for orbital cellulitis given the pain with eye movement. This patient is not a contact lens wearer. The visual acuity is somewhat inhibited by her inability to open the eyelids fully secondary to swelling.Please refer to previous note for full HPI, ROS, PMH and PE.   patient seen and evaluated the bedside, she has significant swelling to upper and lower lid of the right eye with no proptosis, there is conjunctival injection with equal and reactive pupils, no significant discharge. CT reveals a preseptal cellulitis, no orbital cellulitis. This is a shared visit with attending physician who feels that she should come in for IV antibiotics patient is adamant that she would like to go home. We've had an extensive discussion of return precautions and patient states that she understands the return to ED precautions and lives close enough to the emergency room or she can return. Patient is uninsured, will have issues paying for clindamycin, Written her for Bactrim and Keflex and Vicodin for pain control.  Filed Vitals:   07/13/15 1416 07/13/15 1748  BP: 139/96 131/93  Pulse: 88 84  Temp: 98.1 F (36.7 C)   TempSrc: Oral   Resp: 18 16  Height:  (1.651 m)   Weight: 97.523 kg   SpO2: 99% 100%    Medications  tetracaine (PONTOCAINE) 0.5 % ophthalmic solution 2 drop (2 drops Right Eye Given by Other 07/13/15 1450)  fluorescein ophthalmic strip 1 strip (1 strip Right Eye Given 07/13/15 1457)  morphine 4 MG/ML injection 4 mg (4 mg Intravenous  Given 07/13/15 1519)  iohexol (OMNIPAQUE) 300 MG/ML solution 75 mL (75 mLs Intravenous Contrast Given 07/13/15 1623)  cephALEXin (KEFLEX) capsule 500 mg (500 mg Oral Given 07/13/15 1745)  sulfamethoxazole-trimethoprim (BACTRIM DS,SEPTRA DS) 800-160 MG per tablet 2 tablet (2 tablets Oral Given 07/13/15 1745)  HYDROcodone-acetaminophen (NORCO/VICODIN) 5-325 MG per tablet 1 tablet (1 tablet Oral Given 07/13/15 1745)     Discharge Medication List as of 07/13/2015  5:36 PM    START taking these medications   Details  cephALEXin (KEFLEX) 500 MG capsule Take 1 capsule (500 mg total) by mouth 4 (four) times daily., Starting 07/13/2015, Until Discontinued, Print    HYDROcodone-acetaminophen (NORCO/VICODIN) 5-325 MG tablet Take 1-2 tablets by mouth every 6 hours as needed for pain and/or cough., Print    sulfamethoxazole-trimethoprim (BACTRIM DS,SEPTRA DS) 800-160 MG tablet Take 2 tablets by mouth 2 (two) times daily., Starting 07/13/2015, Until Sun 07/18/15, Print  Wynetta Emery, PA-C 07/13/15 1938  Glynn Octave, MD 07/13/15 [phone]

## 2015-07-18 ENCOUNTER — Encounter (HOSPITAL_COMMUNITY): Payer: Self-pay | Admitting: *Deleted

## 2015-07-18 ENCOUNTER — Emergency Department (HOSPITAL_COMMUNITY)
Admission: EM | Admit: 2015-07-18 | Discharge: 2015-07-18 | Disposition: A | Discharge status: Home / Self Care | Payer: Self-pay | Attending: Emergency Medicine | Admitting: Emergency Medicine

## 2015-07-18 ENCOUNTER — Emergency Department (HOSPITAL_COMMUNITY): Payer: Medicaid Other

## 2015-07-18 DIAGNOSIS — R0981 Nasal congestion: Secondary | ICD-10-CM | POA: Insufficient documentation

## 2015-07-18 DIAGNOSIS — Z792 Long term (current) use of antibiotics: Secondary | ICD-10-CM | POA: Insufficient documentation

## 2015-07-18 DIAGNOSIS — Z79899 Other long term (current) drug therapy: Secondary | ICD-10-CM | POA: Insufficient documentation

## 2015-07-18 DIAGNOSIS — H11421 Conjunctival edema, right eye: Secondary | ICD-10-CM | POA: Insufficient documentation

## 2015-07-18 DIAGNOSIS — B309 Viral conjunctivitis, unspecified: Secondary | ICD-10-CM | POA: Insufficient documentation

## 2015-07-18 DIAGNOSIS — R6883 Chills (without fever): Secondary | ICD-10-CM | POA: Insufficient documentation

## 2015-07-18 DIAGNOSIS — Z880 Allergy status to penicillin: Secondary | ICD-10-CM | POA: Insufficient documentation

## 2015-07-18 DIAGNOSIS — F17210 Nicotine dependence, cigarettes, uncomplicated: Secondary | ICD-10-CM | POA: Insufficient documentation

## 2015-07-18 LAB — CBC WITH DIFFERENTIAL/PLATELET
BASOS PCT: 1 %
Basophils Absolute: 0.1 10*3/uL (ref 0.0–0.1)
Eosinophils Absolute: 0.4 10*3/uL (ref 0.0–0.7)
Eosinophils Relative: 6 %
HCT: 35.1 % — ABNORMAL LOW (ref 36.0–46.0)
HEMOGLOBIN: 11.3 g/dL — AB (ref 12.0–15.0)
LYMPHS ABS: 3.6 10*3/uL (ref 0.7–4.0)
Lymphocytes Relative: 45 %
MCH: 25.1 pg — AB (ref 26.0–34.0)
MCHC: 32.2 g/dL (ref 30.0–36.0)
MCV: 78 fL (ref 78.0–100.0)
Monocytes Absolute: 0.5 10*3/uL (ref 0.1–1.0)
Monocytes Relative: 6 %
NEUTROS PCT: 42 %
Neutro Abs: 3.3 10*3/uL (ref 1.7–7.7)
Platelets: 475 10*3/uL — ABNORMAL HIGH (ref 150–400)
RBC: 4.5 MIL/uL (ref 3.87–5.11)
RDW: 15.5 % (ref 11.5–15.5)
WBC: 7.8 10*3/uL (ref 4.0–10.5)

## 2015-07-18 LAB — I-STAT CREATININE, ED: Creatinine, Ser: 0.7 mg/dL (ref 0.44–1.00)

## 2015-07-18 MED ORDER — OXYCODONE-ACETAMINOPHEN 5-325 MG PO TABS
1.0000 | ORAL_TABLET | Freq: Once | ORAL | Status: AC
  Administered 2015-07-18: 1 via ORAL
  Filled 2015-07-18: qty 1

## 2015-07-18 MED ORDER — IOHEXOL 300 MG/ML  SOLN
80.0000 mL | Freq: Once | INTRAMUSCULAR | Status: AC | PRN
  Administered 2015-07-18: 80 mL via INTRAVENOUS

## 2015-07-18 MED ORDER — FLUORESCEIN SODIUM 1 MG OP STRP
1.0000 | ORAL_STRIP | Freq: Once | OPHTHALMIC | Status: AC
  Administered 2015-07-18: 1 via OPHTHALMIC
  Filled 2015-07-18: qty 1

## 2015-07-18 MED ORDER — TETRACAINE HCL 0.5 % OP SOLN
2.0000 [drp] | Freq: Once | OPHTHALMIC | Status: AC
  Administered 2015-07-18: 2 [drp] via OPHTHALMIC
  Filled 2015-07-18: qty 4

## 2015-07-18 MED ORDER — ONDANSETRON 4 MG PO TBDP: 4.0000 mg | ORAL_TABLET | Freq: Three times a day (TID) | ORAL | Status: DC | PRN

## 2015-07-18 NOTE — ED Notes (Signed)
Patient transported to CT 

## 2015-07-18 NOTE — ED Notes (Signed)
Nurse drawing labs. 

## 2015-07-18 NOTE — Discharge Instructions (Signed)
Continue taking her oral antibiotics as prescribed. I recommend taking your antibiotics after eating something to try to avoid nausea. Follow-up with Dr. Cathey Endow in his clinic tomorrow morning at 8 AM. Return to the emergency department if symptoms worsen or new onset of fever, facial swelling, redness, loss of vision, drainage.

## 2015-07-18 NOTE — ED Provider Notes (Signed)
CSN: [id number]     Arrival date & time 07/18/15  1444 History  By signing my name below, I, Soijett Blue, attest that this documentation has been prepared under the direction and in the presence of Melburn Hake, PA-C Electronically Signed: Soijett Blue, ED Scribe. 07/18/2015. 4:08 PM.   Chief Complaint  Patient presents with  . Eye Problem     The history is provided by the patient. No language interpreter was used.    HPI Comments: Monica Douglas is a 25 y.o. female who presents to the Emergency Department complaining of constant, sharp, right eye pain onset 8 days ago. She reports that she has been taking the antibiotics that she was Rx she was given in the ED on 07/13/15 but denies any improvement of her eye swelling. She notes that she still has 2 days of bactrim and keflex left. Denies wearing contacts or glasses. Denies trauma or foreign body sensation to her eye. Pt has an appointment with the opthomalogist in 2 days. Endorses associated right eye swelling, bilateral eye redness, bilateral white/yellow drainage, right eye pain, worsening blurred vision to right eye, chills x 3 days, nasal congestion, right sided HA, photophobia, and right lower and upper lid swelling. She reports that she had specks of pink blood draining from her right eye that she noticed when she wiped her eye this morning. She states that she has tried Rx bactrim, keflex, norco, Polytrim, with no relief for her symptoms. She denies fever, rhinorrhea, sinus pressure, eye itching, and any other symptoms.   Per pt chart review: Pt was seen on 07/10/2015 in the ED for right eye pain and dx with conjunctivitis of right eye. Pt was Rx Polytrim and informed to follow up with PCP. Pt was then seen in the ED on 07/13/2015 for eyelid swelling and had a CT orbital completed which showed periorbital cellulitis. Pt was dx with periorbital cellulitis of right eye. Pt declined admission for IV antibiotics. PT also didn't have the funds  to pay for clindamycin and that is why the pt was Rx bactrim, keflex, and norco. Patient denies following up with ophthalmology since her previous ED visits.   History reviewed. No pertinent past medical history. History reviewed. No pertinent past surgical history. Family History  Problem Relation Age of Onset  . Hypertension Mother   . Lupus Sister    Social History  Substance Use Topics  . Smoking status: Current Every Day Smoker -- 0.25 packs/day    Types: Cigarettes  . Smokeless tobacco: Never Used  . Alcohol Use: Yes     Comment: occa   OB History    No data available     Review of Systems  Constitutional: Positive for chills. Negative for fever.  HENT: Positive for congestion. Negative for rhinorrhea.   Eyes: Positive for photophobia, pain, discharge, redness, itching and visual disturbance.  Neurological: Positive for headaches.  All other systems reviewed and are negative.     Allergies  Peanut-containing drug products; Mustard seed; Shellfish allergy; Tomato; and Penicillins  Home Medications   Prior to Admission medications   Medication Sig Start Date End Date Taking? Authorizing Provider  cephALEXin (KEFLEX) 500 MG capsule Take 1 capsule (500 mg total) by mouth 4 (four) times daily. 07/13/15  Yes Tabias Swayze Pisciotta, PA-C  Multiple Vitamins-Minerals (MULTIVITAMIN & MINERAL PO) Take 1 tablet by mouth daily.   Yes Historical Provider, MD  sulfamethoxazole-trimethoprim (BACTRIM DS,SEPTRA DS) 800-160 MG tablet Take 2 tablets by mouth 2 (two) times  daily. 07/13/15 07/18/15 Yes Daenerys Buttram Pisciotta, PA-C  HYDROcodone-acetaminophen (NORCO/VICODIN) 5-325 MG tablet Take 1-2 tablets by mouth every 6 hours as needed for pain and/or cough. Patient not taking: Reported on 07/18/2015 07/13/15   Joni Reining Pisciotta, PA-C  metroNIDAZOLE (FLAGYL) 500 MG tablet Take 1 tablet (500 mg total) by mouth 2 (two) times daily. Patient not taking: Reported on 07/13/2015 03/24/14   Marissa Sciacca, PA-C   ondansetron (ZOFRAN ODT) 4 MG disintegrating tablet Take 1 tablet (4 mg total) by mouth every 8 (eight) hours as needed for nausea or vomiting. 07/18/15   Barrett Henle, PA-C  trimethoprim-polymyxin b (POLYTRIM) ophthalmic solution Place 1 drop into the right eye every 4 (four) hours. Patient not taking: Reported on 07/18/2015 07/10/15   Chase Picket Ward, PA-C   BP 128/88 mmHg  Pulse 79  Temp(Src) 98.5 F (36.9 C) (Oral)  Resp 20  SpO2 100%  LMP 07/03/2015 Physical Exam  Constitutional: She is oriented to person, place, and time. She appears well-developed and well-nourished. No distress.  HENT:  Head: Normocephalic and atraumatic.  Right Ear: External ear normal.  Left Ear: External ear normal.  Nose: Nose normal.  Mouth/Throat: Oropharynx is clear and moist. No oropharyngeal exudate.  Eyes: EOM are normal. Pupils are equal, round, and reactive to light. Right eye exhibits chemosis and discharge (watery). Right eye exhibits no hordeolum. No foreign body present in the right eye. Left eye exhibits no chemosis, no discharge and no hordeolum. No foreign body present in the left eye. Right conjunctiva is injected. Left conjunctiva is injected. No scleral icterus.  Slit lamp exam:      The right eye shows no corneal abrasion, no corneal flare, no corneal ulcer, no foreign body, no hyphema and no fluorescein uptake.       The left eye shows no corneal abrasion, no corneal flare, no corneal ulcer, no foreign body, no hyphema and no fluorescein uptake.  Moderate swelling noted to right upper and lower lid with erythema and TTP. Light pink watery discharge noted to the right eye. IOP 33 on right eye. Subconjunctival hemorrhages in right eye.   Neck: Normal range of motion. Neck supple.  Cardiovascular: Normal rate, regular rhythm, normal heart sounds and intact distal pulses.  Exam reveals no gallop and no friction rub.   No murmur heard. Pulmonary/Chest: Effort normal and breath sounds  normal. No respiratory distress. She has no wheezes. She has no rales.  Abdominal: She exhibits no distension. There is no tenderness.  Musculoskeletal: Normal range of motion.  Neurological: She is alert and oriented to person, place, and time.  Skin: Skin is warm and dry.  Psychiatric: She has a normal mood and affect. Her behavior is normal.  Nursing note and vitals reviewed.   ED Course  Procedures (including critical care time) DIAGNOSTIC STUDIES: Oxygen Saturation is 100% on RA, nl by my interpretation.    COORDINATION OF CARE: 4:07 PM Discussed treatment plan with pt at bedside which includes consult with attending and pt agreed to plan.  5:08 PM- Consult with Attending, Dr. Anitra Lauth who recommends CT orbits and consult opthalmology.  7:16 PM- Consult with Dr. Cathey Endow. He reports that he suspects viral conjunctivitis and recommends to continue use of oral antibiotics and follow up in the office tomorrow at 8:00 AM.     Visual Acuity  Right Eye Distance:   Left Eye Distance:   Bilateral Distance:    Right Eye Near: R Near: 20/40 Left Eye Near:  L Near:  20/30 Bilateral Near:  20/20  Labs Review Labs Reviewed  CBC WITH DIFFERENTIAL/PLATELET - Abnormal; Notable for the following:    Hemoglobin 11.3 (*)    HCT 35.1 (*)    MCH 25.1 (*)    Platelets 475 (*)    All other components within normal limits  I-STAT CREATININE, ED    Imaging Review Ct Orbits W/cm  07/18/2015  CLINICAL DATA:  Bilateral eye pain, swelling, and redness EXAM: CT ORBITS WITH CONTRAST TECHNIQUE: Multidetector CT imaging of the orbits was performed following the bolus administration of intravenous contrast. CONTRAST:  80mL OMNIPAQUE IOHEXOL 300 MG/ML  SOLN COMPARISON:  CT orbits dated 07/13/2015 FINDINGS: Mild preseptal soft tissue swelling overlying the right orbit (series 3/ image 27), improved from recent CT. Remaining orbit, including the globe and retroconal soft tissues, are within normal limits.  Left orbit, including the globes and retroconal soft tissues, are within normal limits. Visualized paranasal sinuses are clear. Minimal partial opacification of the left mastoid air cells (series 4/image 18). No evidence of maxillofacial fracture. Bilateral mandibular condyles are well-seated in the TMJs. Visualized brain parenchyma is within normal limits. IMPRESSION: Mild preseptal soft tissue swelling/cellulitis on the right, improved from recent CT. Left orbit is within normal limits. Electronically Signed   By: Charline Bills M.D.   On: 07/18/2015 18:12   I have personally reviewed and evaluated these images and lab results as part of my medical decision-making.  Filed Vitals:   07/18/15 1537 07/18/15 1930  BP: 154/95 128/88  Pulse: 89 79  Temp: 98.3 F (36.8 C) 98.5 F (36.9 C)  Resp: 20      MDM   Final diagnoses:  Viral conjunctivitis    Patient presents with right eye swelling, redness and pain. She has been seen in the ED twice over the past 2 weeks for similar symptoms and notes she has been taking her antibiotics as prescribed but denies following up with ophthalmology. Endorses blurred vision, denies fever. VSS. Exam revealed mild tenderness, swelling and erythema to right upper and lower eyelid, injected left and right conjunctiva with subconjunctival hemorrhages on right, EOMI, PERRL. No fluorescein uptake bilaterally. Right IOP 33. CT orbits revealed mild preseptal soft tissue swelling/cellulitis on the right improved from recent CT. WBC 7.8. Consult to ophthalmology, Dr. Cathey Endow. He discuss that he suspects the patient has viral conjunctivitis with lid edema. He advised to have the patient continue taking her oral antibiotics of Bactrim and Keflex and see him in his office tomorrow morning. Discussed results, treatment plan and discharge follow-up with patient. Patient agrees to follow-up with the ophthalmologist in the morning.  I personally performed the services described  in this documentation, which was scribed in my presence. The recorded information has been reviewed and is accurate.    Satira Sark [place], [place] 07/18/15 2021  Gwyneth Sprout, MD 07/19/15 [phone]

## 2015-07-18 NOTE — ED Notes (Signed)
10 mL blood drawn off SL & disposed prior to obtaining blood specimen.

## 2015-07-18 NOTE — ED Notes (Signed)
Pt awaiting results prior to d/c per Nicole, CorJoni Reiningia Poche

## 2015-07-18 NOTE — ED Notes (Signed)
Pt reports bila eye pain, swelling and redness.  This is the third visit for same but was only in her R eye.  Now, L eye is swollen, red and painful.  Warm to touch per pt.  Pt reports R eye is also bleeding and her lower lid is now swollen as well.

## 2015-09-27 ENCOUNTER — Emergency Department (HOSPITAL_COMMUNITY)
Admission: EM | Admit: 2015-09-27 | Discharge: 2015-09-27 | Disposition: A | Discharge status: Home / Self Care | Payer: Medicaid Other | Attending: Emergency Medicine | Admitting: Emergency Medicine

## 2015-09-27 ENCOUNTER — Encounter (HOSPITAL_COMMUNITY): Payer: Self-pay | Admitting: Oncology

## 2015-09-27 DIAGNOSIS — Z792 Long term (current) use of antibiotics: Secondary | ICD-10-CM | POA: Insufficient documentation

## 2015-09-27 DIAGNOSIS — F17210 Nicotine dependence, cigarettes, uncomplicated: Secondary | ICD-10-CM | POA: Insufficient documentation

## 2015-09-27 DIAGNOSIS — Z880 Allergy status to penicillin: Secondary | ICD-10-CM | POA: Insufficient documentation

## 2015-09-27 DIAGNOSIS — H9209 Otalgia, unspecified ear: Secondary | ICD-10-CM | POA: Insufficient documentation

## 2015-09-27 DIAGNOSIS — Z79899 Other long term (current) drug therapy: Secondary | ICD-10-CM | POA: Insufficient documentation

## 2015-09-27 DIAGNOSIS — Z0289 Encounter for other administrative examinations: Secondary | ICD-10-CM

## 2015-09-27 DIAGNOSIS — J029 Acute pharyngitis, unspecified: Secondary | ICD-10-CM | POA: Insufficient documentation

## 2015-09-27 MED ORDER — DEXAMETHASONE 4 MG PO TABS
10.0000 mg | ORAL_TABLET | Freq: Once | ORAL | Status: AC
  Administered 2015-09-27: 10 mg via ORAL
  Filled 2015-09-27: qty 2
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
## 2015-09-27 NOTE — Discharge Instructions (Signed)
Take 4 over the counter ibuprofen tablets 3 times a day or 2 over-the-counter naproxen tablets twice a day for pain. Tylenol 1-2 tabs po q4h prn  Sore Throat A sore throat is a painful, burning, sore, or scratchy feeling of the throat. There may be pain or tenderness when swallowing or talking. You may have other symptoms with a sore throat. These include coughing, sneezing, fever, or a swollen neck. A sore throat is often the first sign of another sickness. These sicknesses may include a cold, flu, strep throat, or an infection called mono. Most sore throats go away without medical treatment.  HOME CARE   Only take medicine as told by your doctor.  Drink enough fluids to keep your pee (urine) clear or pale yellow.  Rest as needed.  Try using throat sprays, lozenges, or suck on hard candy (if older than 4 years or as told).  Sip warm liquids, such as broth, herbal tea, or warm water with honey. Try sucking on frozen ice pops or drinking cold liquids.  Rinse the mouth (gargle) with salt water. Mix 1 teaspoon salt with 8 ounces of water.  Do not smoke. Avoid being around others when they are smoking.  Put a humidifier in your bedroom at night to moisten the air. You can also turn on a hot shower and sit in the bathroom for 5-10 minutes. Be sure the bathroom door is closed. GET HELP RIGHT AWAY IF:   You have trouble breathing.  You cannot swallow fluids, soft foods, or your spit (saliva).  You have more puffiness (swelling) in the throat.  Your sore throat does not get better in 7 days.  You feel sick to your stomach (nauseous) and throw up (vomit).  You have a fever or lasting symptoms for more than 2-3 days.  You have a fever and your symptoms suddenly get worse. MAKE SURE YOU:   Understand these instructions.  Will watch your condition.  Will get help right away if you are not doing well or get worse.   This information is not intended to replace advice given to you by  your health care provider. Make sure you discuss any questions you have with your health care provider.   Document Released: 03/07/2008 Document Revised: 02/21/2012 Document Reviewed: 02/04/2012 Elsevier Interactive Patient Education [employer]2016 [employer]

## 2015-12-13 ENCOUNTER — Encounter (HOSPITAL_COMMUNITY): Payer: Self-pay | Admitting: Emergency Medicine

## 2015-12-13 ENCOUNTER — Emergency Department (HOSPITAL_COMMUNITY)
Admission: EM | Admit: 2015-12-13 | Discharge: 2015-12-14 | Disposition: A | Discharge status: Home / Self Care | Payer: Medicaid Other | Attending: Emergency Medicine | Admitting: Emergency Medicine

## 2015-12-13 DIAGNOSIS — Z792 Long term (current) use of antibiotics: Secondary | ICD-10-CM | POA: Insufficient documentation

## 2015-12-13 DIAGNOSIS — F17210 Nicotine dependence, cigarettes, uncomplicated: Secondary | ICD-10-CM | POA: Insufficient documentation

## 2015-12-13 DIAGNOSIS — J069 Acute upper respiratory infection, unspecified: Secondary | ICD-10-CM | POA: Insufficient documentation

## 2015-12-13 NOTE — ED Notes (Signed)
Patient complaining of a cough and sore throat x 1 day. Patient states her chest is sore from coughing. Patient has not had a fever, nausea, diarrhea, or vomiting.

## 2015-12-14 LAB — RAPID STREP SCREEN (MED CTR MEBANE ONLY): STREPTOCOCCUS, GROUP A SCREEN (DIRECT): NEGATIVE

## 2015-12-14 MED ORDER — BENZONATATE 100 MG PO CAPS: 100.0000 mg | ORAL_CAPSULE | Freq: Three times a day (TID) | ORAL | Status: DC

## 2015-12-14 MED ORDER — PREDNISONE 20 MG PO TABS: 40.0000 mg | ORAL_TABLET | Freq: Every day | ORAL | Status: DC

## 2015-12-14 MED ORDER — ALBUTEROL SULFATE HFA 108 (90 BASE) MCG/ACT IN AERS
2.0000 | INHALATION_SPRAY | RESPIRATORY_TRACT | Status: DC | PRN
  Administered 2015-12-14: 2 via RESPIRATORY_TRACT
  Filled 2015-12-14: qty 6.7

## 2015-12-14 MED ORDER — PREDNISONE 20 MG PO TABS
60.0000 mg | ORAL_TABLET | Freq: Once | ORAL | Status: AC
  Administered 2015-12-14: 60 mg via ORAL
  Filled 2015-12-14: qty 3

## 2015-12-14 MED ORDER — HYDROCODONE-HOMATROPINE 5-1.5 MG/5ML PO SYRP: 5.0000 mL | ORAL_SOLUTION | Freq: Four times a day (QID) | ORAL | Status: DC | PRN

## 2015-12-14 NOTE — ED Notes (Signed)
Bed: WTR9 Expected date:  Expected time:  Means of arrival:  Comments: 

## 2015-12-14 NOTE — Discharge Instructions (Signed)
Upper Respiratory Infection, Adult Most upper respiratory infections (URIs) are a viral infection of the air passages leading to the lungs. A URI affects the nose, throat, and upper air passages. The most common type of URI is nasopharyngitis and is typically referred to as "the common cold." URIs run their course and usually go away on their own. Most of the time, a URI does not require medical attention, but sometimes a bacterial infection in the upper airways can follow a viral infection. This is called a secondary infection. Sinus and middle ear infections are common types of secondary upper respiratory infections. Bacterial pneumonia can also complicate a URI. A URI can worsen asthma and chronic obstructive pulmonary disease (COPD). Sometimes, these complications can require emergency medical care and may be life threatening.  CAUSES Almost all URIs are caused by viruses. A virus is a type of germ and can spread from one person to another.  RISKS FACTORS You may be at risk for a URI if:   You smoke.   You have chronic heart or lung disease.  You have a weakened defense (immune) system.   You are very young or very old.   You have nasal allergies or asthma.  You work in crowded or poorly ventilated areas.  You work in health care facilities or schools. SIGNS AND SYMPTOMS  Symptoms typically develop 2-3 days after you come in contact with a cold virus. Most viral URIs last 7-10 days. However, viral URIs from the influenza virus (flu virus) can last 14-18 days and are typically more severe. Symptoms may include:   Runny or stuffy (congested) nose.   Sneezing.   Cough.   Sore throat.   Headache.   Fatigue.   Fever.   Loss of appetite.   Pain in your forehead, behind your eyes, and over your cheekbones (sinus pain).  Muscle aches.  DIAGNOSIS  Your health care provider may diagnose a URI by:  Physical exam.  Tests to check that your symptoms are not due to  another condition such as:  Strep throat.  Sinusitis.  Pneumonia.  Asthma. TREATMENT  A URI goes away on its own with time. It cannot be cured with medicines, but medicines may be prescribed or recommended to relieve symptoms. Medicines may help:  Reduce your fever.  Reduce your cough.  Relieve nasal congestion. HOME CARE INSTRUCTIONS   Take medicines only as directed by your health care provider.   Gargle warm saltwater or take cough drops to comfort your throat as directed by your health care provider.  Use a warm mist humidifier or inhale steam from a shower to increase air moisture. This may make it easier to breathe.  Drink enough fluid to keep your urine clear or pale yellow.   Eat soups and other clear broths and maintain good nutrition.   Rest as needed.   Return to work when your temperature has returned to normal or as your health care provider advises. You may need to stay home longer to avoid infecting others. You can also use a face mask and careful hand washing to prevent spread of the virus.  Increase the usage of your inhaler if you have asthma.   Do not use any tobacco products, including cigarettes, chewing tobacco, or electronic cigarettes. If you need help quitting, ask your health care provider. PREVENTION  The best way to protect yourself from getting a cold is to practice good hygiene.   Avoid oral or hand contact with people with cold   symptoms.   Wash your hands often if contact occurs.  There is no clear evidence that vitamin C, vitamin E, echinacea, or exercise reduces the chance of developing a cold. However, it is always recommended to get plenty of rest, exercise, and practice good nutrition.  SEEK MEDICAL CARE IF:   You are getting worse rather than better.   Your symptoms are not controlled by medicine.   You have chills.  You have worsening shortness of breath.  You have brown or red mucus.  You have yellow or brown nasal  discharge.  You have pain in your face, especially when you bend forward.  You have a fever.  You have swollen neck glands.  You have pain while swallowing.  You have white areas in the back of your throat. SEEK IMMEDIATE MEDICAL CARE IF:   You have severe or persistent:  Headache.  Ear pain.  Sinus pain.  Chest pain.  You have chronic lung disease and any of the following:  Wheezing.  Prolonged cough.  Coughing up blood.  A change in your usual mucus.  You have a stiff neck.  You have changes in your:  Vision.  Hearing.  Thinking.  Mood. MAKE SURE YOU:   Understand these instructions.  Will watch your condition.  Will get help right away if you are not doing well or get worse.   This information is not intended to replace advice given to you by your health care provider. Make sure you discuss any questions you have with your health care provider.   Document Released: 11/22/2000 Document Revised: 10/13/2014 Document Reviewed: 09/03/2013 Elsevier Interactive Patient Education 2016 [employer]  

## 2015-12-14 NOTE — ED Notes (Signed)
Bed: WA06 Expected date:  Expected time:  Means of arrival:  Comments: 

## 2015-12-14 NOTE — ED Provider Notes (Signed)
CSN: [id number]     Arrival date & time 12/13/15  2332 History  By signing my name below, I, Monica Douglas, attest that this documentation has been prepared under the direction and in the presence of Gilda Creasehristopher J Pollina, MD. Electronically Signed: Tanda RockersMargaux Douglas, ED Scribe. 12/14/2015. 1:34 AM.   Chief Complaint  Patient presents with  . Sore Throat  . Cough   The history is provided by the patient. No language interpreter was used.    HPI Comments: Monica BullaShaquail T Douglas is a 25 y.o. female who presents to the Emergency Department complaining of gradual onset, constant, sore throat that began yesterday. Pt also complains of cough, shortness of breath with coughing, and sneezing. No hx asthma. Denies fever, chills, or any other associated symptoms.   History reviewed. No pertinent past medical history. History reviewed. No pertinent past surgical history. Family History  Problem Relation Age of Onset  . Hypertension Mother   . Lupus Sister    Social History  Substance Use Topics  . Smoking status: Current Every Day Smoker -- 0.25 packs/day    Types: Cigarettes  . Smokeless tobacco: Never Used  . Alcohol Use: Yes     Comment: occa   OB History    No data available     Review of Systems  Constitutional: Negative for fever and chills.  HENT: Positive for sneezing and sore throat.   Respiratory: Positive for cough and shortness of breath.   All other systems reviewed and are negative.  Allergies  Peanut-containing drug products; Mustard seed; Shellfish allergy; Tomato; and Penicillins  Home Medications   Prior to Admission medications   Medication Sig Start Date End Date Taking? Authorizing Provider  cephALEXin (KEFLEX) 500 MG capsule Take 1 capsule (500 mg total) by mouth 4 (four) times daily. 07/13/15   Nicole Pisciotta, PA-C  HYDROcodone-acetaminophen (NORCO/VICODIN) 5-325 MG tablet Take 1-2 tablets by mouth every 6 hours as needed for pain and/or cough. Patient not taking:  Reported on 07/18/2015 07/13/15   Joni ReiningNicole Pisciotta, PA-C  metroNIDAZOLE (FLAGYL) 500 MG tablet Take 1 tablet (500 mg total) by mouth 2 (two) times daily. Patient not taking: Reported on 07/13/2015 03/24/14   Marissa Sciacca, PA-C  Multiple Vitamins-Minerals (MULTIVITAMIN & MINERAL PO) Take 1 tablet by mouth daily.    Historical Provider, MD  ondansetron (ZOFRAN ODT) 4 MG disintegrating tablet Take 1 tablet (4 mg total) by mouth every 8 (eight) hours as needed for nausea or vomiting. 07/18/15   Barrett HenleNicole Elizabeth Nadeau, PA-C  trimethoprim-polymyxin b (POLYTRIM) ophthalmic solution Place 1 drop into the right eye every 4 (four) hours. Patient not taking: Reported on 07/18/2015 07/10/15   [facility] Center[facility]ime Pilcher Ward, PA-C   BP 127/92 mmHg  Pulse 95  Temp(Src) 98.5 F (36.9 C)  Resp 20  Ht 5\' 5"  (1.651 m)  Wt 200 lb (90.719 kg)  BMI 33.28 kg/m2  SpO2 99%   Physical Exam  Constitutional: She is oriented to person, place, and time. She appears well-developed and well-nourished. No distress.  HENT:  Head: Normocephalic and atraumatic.  Right Ear: Hearing normal.  Left Ear: Hearing normal.  Mouth/Throat: Oropharynx is clear and moist and mucous membranes are normal.  Nasal congestion  Eyes: Conjunctivae and EOM are normal. Pupils are equal, round, and reactive to light.  Neck: Normal range of motion. Neck supple.  Cardiovascular: Regular rhythm, S1 normal and S2 normal.  Exam reveals no gallop and no friction rub.   No murmur heard. Pulmonary/Chest: Effort normal and breath  sounds normal. No respiratory distress. She has no wheezes. She has no rales. She exhibits no tenderness.  Abdominal: Soft. Normal appearance and bowel sounds are normal. There is no hepatosplenomegaly. There is no tenderness. There is no rebound, no guarding, no tenderness at McBurney's point and negative Murphy's sign. No hernia.  Musculoskeletal: Normal range of motion.  Neurological: She is alert and oriented to person, place, and time.  She has normal strength. No cranial nerve deficit or sensory deficit. Coordination normal. GCS eye subscore is 4. GCS verbal subscore is 5. GCS motor subscore is 6.  Skin: Skin is warm, dry and intact. No rash noted. No cyanosis.  Psychiatric: She has a normal mood and affect. Her speech is normal and behavior is normal. Thought content normal.  Nursing note and vitals reviewed.   ED Course  Procedures (including critical care time)  DIAGNOSTIC STUDIES: Oxygen Saturation is 99% on RA, normal by my interpretation.    COORDINATION OF CARE: 1:32 AM-Discussed treatment plan which includes Rx albuterol inhaler and Prednisone with pt at bedside and pt agreed to plan.   Labs Review Labs Reviewed  RAPID STREP SCREEN (NOT AT [facility])  CULTURE, GROUP A STREP [facility])    Imaging Review No results found. I have personally reviewed and evaluated these images and lab results as part of my medical decision-making.   EKG Interpretation None      MDM   Final diagnoses:  None  URI  Presents to the ER for evaluation of 2 days of sore throat, cough and congestion. Patient is afebrile. Vital signs are normal. Examination unremarkable. She does not have any clinical signs of pneumonia, no wheezing. We'll treat symptomatically.  I personally performed the services described in this documentation, which was scribed in my presence. The recorded information has been reviewed and is accurate.      Gilda Creasehristopher J Pollina, MD 12/14/15 [phone]

## 2015-12-16 LAB — CULTURE, GROUP A STREP (THRC)

## 2016-01-16 ENCOUNTER — Emergency Department (HOSPITAL_COMMUNITY)
Admission: EM | Admit: 2016-01-16 | Discharge: 2016-01-16 | Disposition: A | Discharge status: Home / Self Care | Payer: Medicaid Other | Attending: Emergency Medicine | Admitting: Emergency Medicine

## 2016-01-16 ENCOUNTER — Encounter (HOSPITAL_COMMUNITY): Payer: Self-pay | Admitting: *Deleted

## 2016-01-16 DIAGNOSIS — J45909 Unspecified asthma, uncomplicated: Secondary | ICD-10-CM | POA: Insufficient documentation

## 2016-01-16 DIAGNOSIS — F17210 Nicotine dependence, cigarettes, uncomplicated: Secondary | ICD-10-CM | POA: Insufficient documentation

## 2016-01-16 DIAGNOSIS — R21 Rash and other nonspecific skin eruption: Secondary | ICD-10-CM | POA: Insufficient documentation

## 2016-01-16 MED ORDER — CLOTRIMAZOLE 1 % EX CREA: TOPICAL_CREAM | CUTANEOUS | 0 refills | Status: DC

## 2016-01-16 NOTE — ED Provider Notes (Signed)
[facility]-EMERGENCY DEPT Provider Note   CSN: [id number] Arrival date & time: 01/16/16  1819  First Provider Contact:   First MD Initiated Contact with Patient 01/16/16 2019     By signing my name below, I, Monica Douglas, attest that this documentation has been prepared under the direction and in the presence of non-physician practitioner, Trixie DredgeEmily Bibiana Gillean, PA-C. Electronically Signed: Freida Busmaniana Douglas, Scribe. 01/16/2016. 10:29 PM.   History   Chief Complaint Chief Complaint  Patient presents with  . Rash    The history is provided by the patient. No language interpreter was used.    HPI Comments:  Monica Douglas is a 25 y.o. female who presents to the Emergency Department complaining of pruritic rash under bilateral breast x 1 week.  She denies fever, chills, body aches. Denies painful or draining lesions.  She also denies rash to any other area on her body. She has applied peroxide and neosporin without relief.   History reviewed. No pertinent past medical history.  Patient Active Problem List   Diagnosis Date Noted  . OBESITY, NOS 08/09/2006  . ASTHMA, UNSPECIFIED 08/09/2006    History reviewed. No pertinent surgical history.  OB History    No data available       Home Medications    Prior to Admission medications   Medication Sig Start Date End Date Taking? Authorizing Provider  benzonatate (TESSALON) 100 MG capsule Take 1 capsule (100 mg total) by mouth every 8 (eight) hours. 12/14/15   Gilda Creasehristopher J Pollina, MD  cephALEXin (KEFLEX) 500 MG capsule Take 1 capsule (500 mg total) by mouth 4 (four) times daily. 07/13/15   Nicole Pisciotta, PA-C  clotrimazole (LOTRIMIN) 1 % cream Apply to affected area 2 times daily 01/16/16   Trixie DredgeEmily Pricella Gaugh, PA-C  HYDROcodone-acetaminophen (NORCO/VICODIN) 5-325 MG tablet Take 1-2 tablets by mouth every 6 hours as needed for pain and/or cough. Patient not taking: Reported on 07/18/2015 07/13/15   Joni ReiningNicole Pisciotta, PA-C  HYDROcodone-homatropine [facility](HYCODAN) 5-1.5  MG/5ML syrup Take 5 mLs by mouth every 6 (six) hours as needed for cough. 12/14/15   Gilda Creasehristopher J Pollina, MD  metroNIDAZOLE (FLAGYL) 500 MG tablet Take 1 tablet (500 mg total) by mouth 2 (two) times daily. Patient not taking: Reported on 07/13/2015 03/24/14   Marissa Sciacca, PA-C  Multiple Vitamins-Minerals (MULTIVITAMIN & MINERAL PO) Take 1 tablet by mouth daily.    Historical Provider, MD  ondansetron (ZOFRAN ODT) 4 MG disintegrating tablet Take 1 tablet (4 mg total) by mouth every 8 (eight) hours as needed for nausea or vomiting. 07/18/15   Barrett HenleNicole Elizabeth Nadeau, PA-C  predniSONE (DELTASONE) 20 MG tablet Take 2 tablets (40 mg total) by mouth daily with breakfast. 12/14/15   Gilda Creasehristopher J Pollina, MD  trimethoprim-polymyxin b (POLYTRIM) ophthalmic solution Place 1 drop into the right eye every 4 (four) hours. Patient not taking: Reported on 07/18/2015 07/10/15   Chase PicketJaime Pilcher Ward, PA-C    Family History Family History  Problem Relation Age of Onset  . Hypertension Mother   . Lupus Sister     Social History Social History  Substance Use Topics  . Smoking status: Current Every Day Smoker    Packs/day: 0.25    Types: Cigarettes  . Smokeless tobacco: Never Used  . Alcohol use Yes     Comment: occa     Allergies   Peanut-containing drug products; Mustard seed; Shellfish allergy; Tomato; and Penicillins   Review of Systems Review of Systems  Constitutional: Negative for chills and fever.  Respiratory: Negative for shortness of breath.   Cardiovascular: Negative for chest pain.  Musculoskeletal: Negative for myalgias.  Skin: Positive for rash.  Allergic/Immunologic: Negative for immunocompromised state.  Psychiatric/Behavioral: Negative for self-injury.     Physical Exam Updated Vital Signs BP 130/89 (BP Location: Left Arm)   Pulse 85   Temp 98.6 F (37 C) (Oral)   Resp 14   LMP 01/02/2016   SpO2 100%   Physical Exam  Constitutional: She appears well-developed and  well-nourished. No distress.  HENT:  Head: Normocephalic and atraumatic.  Neck: Neck supple.  Pulmonary/Chest: Effort normal.  Neurological: She is alert.  Skin: She is not diaphoretic.  multiple hyperpigmented anular lesions without raised edges or erythema under bilateral breasts No erythema, edema, warmth, discharge, or tenderness   Chaperone (scribe) was present for breast exam which was performed with no discomfort or complications.   Nursing note and vitals reviewed.    ED Treatments / Results  DIAGNOSTIC STUDIES:  Oxygen Saturation is 97% on RA, normal by my interpretation.    COORDINATION OF CARE:  8:38 PM Discussed treatment plan with pt at bedside and pt agreed to plan.  Labs (all labs ordered are listed, but only abnormal results are displayed) Labs Reviewed - No data to display  EKG  EKG Interpretation None       Radiology No results found.  Procedures Procedures   Medications Ordered in ED Medications - No data to display   Initial Impression / Assessment and Plan / ED Course  I have reviewed the triage vital signs and the nursing notes.  Pertinent labs & imaging results that were available during my care of the patient were reviewed by me and considered in my medical decision making (see chart for details).  Clinical Course      Final Clinical Impressions(s) / ED Diagnoses  Pt with pruritic rash underneath breasts.  Does not appear candidal.  Patient with possible tinea infection. Will treat with anti-fungal/tinea coproris medication. Pt instructed to keep the area dry. Contact precautions given. No signs of secondary infection. Follow up with PCP, Dermatology. Return precautions discussed. Pt is safe for discharge at this time.  Discussed result, findings, treatment, and follow up  with patient.  Pt given return precautions.  Pt verbalizes understanding and agrees with plan.      Final diagnoses:  Rash    New Prescriptions Discharge  Medication List as of 01/16/2016  8:40 PM    START taking these medications   Details  clotrimazole (LOTRIMIN) 1 % cream Apply to affected area 2 times daily, Print       I personally performed the services described in this documentation, which was scribed in my presence. The recorded information has been reviewed and is accurate.     Trixie Dredge, PA-C 01/16/16 2231    Alvira Monday, MD 01/18/16 [phone]

## 2016-01-16 NOTE — Discharge Instructions (Signed)
Read the information below.  Use the prescribed medication as directed.  Please discuss all new medications with your pharmacist.  You may return to the Emergency Department at any time for worsening condition or any new symptoms that concern you.     If you develop redness, swelling, pus draining from the wound, or fevers greater than 100.4, return to the ER immediately for a recheck.   °

## 2016-01-16 NOTE — ED Triage Notes (Signed)
Pt complains of rash underneath and in between her breasts over the past week, worsening yesterday. Pt states the rash started as little bumps and have increased in size since yesterday.

## 2016-01-23 ENCOUNTER — Encounter (HOSPITAL_COMMUNITY): Payer: Self-pay | Admitting: Emergency Medicine

## 2016-01-23 ENCOUNTER — Emergency Department (HOSPITAL_COMMUNITY)
Admission: EM | Admit: 2016-01-23 | Discharge: 2016-01-23 | Disposition: A | Discharge status: Home / Self Care | Payer: Medicaid Other | Attending: Emergency Medicine | Admitting: Emergency Medicine

## 2016-01-23 DIAGNOSIS — B354 Tinea corporis: Secondary | ICD-10-CM | POA: Insufficient documentation

## 2016-01-23 DIAGNOSIS — F17210 Nicotine dependence, cigarettes, uncomplicated: Secondary | ICD-10-CM | POA: Insufficient documentation

## 2016-01-23 DIAGNOSIS — J45909 Unspecified asthma, uncomplicated: Secondary | ICD-10-CM | POA: Insufficient documentation

## 2016-01-23 DIAGNOSIS — Z91010 Allergy to peanuts: Secondary | ICD-10-CM | POA: Insufficient documentation

## 2016-01-23 DIAGNOSIS — Z79899 Other long term (current) drug therapy: Secondary | ICD-10-CM | POA: Insufficient documentation

## 2016-01-23 DIAGNOSIS — L301 Dyshidrosis [pompholyx]: Secondary | ICD-10-CM | POA: Insufficient documentation

## 2016-01-23 LAB — CBG MONITORING, ED: Glucose-Capillary: 101 mg/dL — ABNORMAL HIGH (ref 65–99)

## 2016-01-23 MED ORDER — KETOCONAZOLE 2 % EX CREA: 1.0000 "application " | TOPICAL_CREAM | Freq: Every day | CUTANEOUS | 1 refills | Status: DC

## 2016-01-23 MED ORDER — PERMETHRIN 5 % EX CREA: TOPICAL_CREAM | CUTANEOUS | 1 refills | Status: DC

## 2016-01-23 MED ORDER — TRIAMCINOLONE ACETONIDE 0.5 % EX OINT: 1.0000 "application " | TOPICAL_OINTMENT | Freq: Two times a day (BID) | CUTANEOUS | 0 refills | Status: DC

## 2016-01-23 MED ORDER — AQUAPHOR EX OINT: TOPICAL_OINTMENT | CUTANEOUS | 0 refills | Status: DC | PRN

## 2016-01-23 NOTE — ED Provider Notes (Cosign Needed)
[facility]-EMERGENCY DEPT Provider Note   CSN: [id number] Arrival date & time: 01/23/16  [id number]  First Provider Contact:  First MD Initiated Contact with Patient 01/23/16 [id number]        History   Chief Complaint Chief Complaint  Patient presents with  . Rash    HPI Monica BullaShaquail T Douglas is a 25 y.o. female who presents for rash. Patient was seen 6 days ago for the same rash. She complains of itchy rash under the breasts and in the fold of her abdominal wall. Patient was diagnosed with tinea corporis and given clotrimazole cream. She states that the area under her breast is improving. However, she developed similar rash in the abdominal folds. Since that time, which is extremely itchy. She also complains of itching and small bumps on her fingers. She denies any palmar involvement or digital webbing involvement. Patient states that it seems to calm and ago. She's had in the past. 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 She was also concerned she may have scabies since she's had in the past. She feels itchy everywhere and is having difficulty sleeping at night. She denies a history of diabetes. Denies contacts with similar,changes in lotions/soaps/detergents, exposure to animal or plant irritants, and denies purulent discharge. '   HPI  History reviewed. No pertinent past medical history.  Patient Active Problem List   Diagnosis Date Noted  . OBESITY, NOS 08/09/2006  . ASTHMA, UNSPECIFIED 08/09/2006    History reviewed. No pertinent surgical history.  OB History    No data available       Home Medications    Prior to Admission medications   Medication Sig Start Date End Date Taking? Authorizing Provider  benzonatate (TESSALON) 100 MG capsule Take 1 capsule (100 mg total) by mouth every 8 (eight) hours. 12/14/15   Gilda Creasehristopher J Pollina, MD  cephALEXin (KEFLEX) 500 MG capsule Take 1 capsule (500 mg total) by mouth 4 (four) times daily. 07/13/15   Nicole  Pisciotta, PA-C  clotrimazole (LOTRIMIN) 1 % cream Apply to affected area 2 times daily 01/16/16   Trixie DredgeEmily West, PA-C  HYDROcodone-acetaminophen (NORCO/VICODIN) 5-325 MG tablet Take 1-2 tablets by mouth every 6 hours as needed for pain and/or cough. Patient not taking: Reported on 07/18/2015 07/13/15   Joni ReiningNicole Pisciotta, PA-C  HYDROcodone-homatropine [facility](HYCODAN) 5-1.5 MG/5ML syrup Take 5 mLs by mouth every 6 (six) hours as needed for cough. 12/14/15   Gilda Creasehristopher J Pollina, MD  metroNIDAZOLE (FLAGYL) 500 MG tablet Take 1 tablet (500 mg total) by mouth 2 (two) times daily. Patient not taking: Reported on 07/13/2015 03/24/14   Marissa Sciacca, PA-C  Multiple Vitamins-Minerals (MULTIVITAMIN & MINERAL PO) Take 1 tablet by mouth daily.    Historical Provider, MD  ondansetron (ZOFRAN ODT) 4 MG disintegrating tablet Take 1 tablet (4 mg total) by mouth every 8 (eight) hours as needed for nausea or vomiting. 07/18/15   Barrett HenleNicole Elizabeth Nadeau, PA-C  predniSONE (DELTASONE) 20 MG tablet Take 2 tablets (40 mg total) by mouth daily with breakfast. 12/14/15   Gilda Creasehristopher J Pollina, MD  trimethoprim-polymyxin b (POLYTRIM) ophthalmic solution Place 1 drop into the right eye every 4 (four) hours. Patient not taking: Reported on 07/18/2015 07/10/15   Chase PicketJaime Pilcher Ward, PA-C    Family History Family History  Problem Relation Age of Onset  . Hypertension Mother   . Lupus Sister     Social History Social History  Substance Use Topics  . Smoking status:  Current Every Day Smoker    Packs/day: 0.25    Types: Cigarettes  . Smokeless tobacco: Never Used  . Alcohol use Yes     Comment: occa     Allergies   Peanut-containing drug products; Mustard seed; Shellfish allergy; Tomato; and Penicillins   Review of Systems Review of Systems  .Ten systems reviewed and are negative for acute change, except as noted in the HPI.    Physical Exam Updated Vital Signs BP 137/89   Pulse 81   Temp 98.6 F (37 C) (Oral)   Resp 16    LMP 01/02/2016   SpO2 98%   Physical Exam  Constitutional: She is oriented to person, place, and time. She appears well-developed and well-nourished. No distress.  HENT:  Head: Normocephalic and atraumatic.  Eyes: Conjunctivae are normal. No scleral icterus.  Neck: Normal range of motion.  Cardiovascular: Normal rate, regular rhythm and normal heart sounds.  Exam reveals no gallop and no friction rub.   No murmur heard. Pulmonary/Chest: Effort normal and breath sounds normal. No respiratory distress.  Abdominal: Soft. Bowel sounds are normal. She exhibits no distension and no mass. There is no tenderness. There is no guarding.  Neurological: She is alert and oriented to person, place, and time.  Skin: Skin is warm and dry. She is not diaphoretic.  Annular and coalesced , well demarcated hyperpigmented lesions with central clearing under bilateral breasts, there is hyperpigmentation and small papules as well as annual lesions in the fold of the abdomen.  Bilateral hands with  Varying sized papules on the fingers. No palmar involvement. No signs of infection.  Nursing note and vitals reviewed.    ED Treatments / Results  Labs (all labs ordered are listed, but only abnormal results are displayed) Labs Reviewed  CBG MONITORING, ED    EKG  EKG Interpretation None       Radiology No results found.  Procedures Procedures (including critical care time)  Medications Ordered in ED Medications - No data to display   Initial Impression / Assessment and Plan / ED Course  I have reviewed the triage vital signs and the nursing notes.  Pertinent labs & imaging results that were available during my care of the patient were reviewed by me and considered in my medical decision making (see chart for details).  Clinical Course  Comment By Time  Patient with tinea corporis. 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 Advised the patient on proper treatment of both.  We will refill her antifungal's with ketoconazole 2% cream to apply once daily. Patient also will hold a prescription of Elimite. She is not to apply unless the rash on her hands begin stew spread upward, or there are other confirmed cases of scabies infections at her job site, Or other known contacts at home. Patient has a mildly elevated glucose. Given her obesity. She has risk factors for prediabetes. She appears otherwise safe for discharge. Discussed return precautions with the patient. Arthor Captain, PA-C 08/13 1042      Final Clinical Impressions(s) / ED Diagnoses   Final diagnoses:  None    New Prescriptions New Prescriptions   No medications on file  Dictation #1 [id number]  [id number]    Arthor Captain, PA-C 01/23/16 1044

## 2016-01-23 NOTE — ED Notes (Signed)
Patient is A & O x4.  She understood discharge instructions with no concerns/questions.

## 2016-01-23 NOTE — Discharge Instructions (Signed)
Do NOT use the Elemite unless your hand symptoms are worsening and spreading, and there are other confirmed scabies cases. Please treat the hands and body as directed.  Return for symptoms of a secondary infection such as heat, redness, pain in the rash, development of pus, fevers or chills. Please use Benadryl at night for itching

## 2016-01-23 NOTE — ED Triage Notes (Signed)
Pt reports rash underneath bilateral breasts for the past week. Was seen here recently for same, but reports now rash is spreading to her abd.

## 2016-03-30 NOTE — ED Provider Notes (Signed)
  Physical Exam  BP 135/92 (BP Location: Left Arm)   Pulse 75   Temp 98.8 F (37.1 C) (Oral)   Resp 18   Ht 5\' 5"  (1.651 m)   Wt 227 lb (103 kg)   LMP 01/02/2016   SpO2 100%   BMI 37.77 kg/m   Physical Exam  ED Course  Procedures  MDM Medical screening examination/treatment/procedure(s) were performed by non-physician practitioner and as supervising physician I was immediately available for consultation/collaboration.   EKG Interpretation None            Benjiman CoreNathan Levert Heslop, MD 03/30/16 1658

## 2016-08-24 ENCOUNTER — Ambulatory Visit (HOSPITAL_COMMUNITY)
Admission: EM | Admit: 2016-08-24 | Discharge: 2016-08-24 | Disposition: A | Discharge status: Home / Self Care | Payer: Medicaid Other

## 2016-08-24 ENCOUNTER — Encounter (HOSPITAL_COMMUNITY): Payer: Self-pay

## 2016-08-24 DIAGNOSIS — R030 Elevated blood-pressure reading, without diagnosis of hypertension: Secondary | ICD-10-CM

## 2016-08-24 NOTE — ED Provider Notes (Signed)
CSN: [id number]     Arrival date & time 08/24/16  1249 History   None    Chief Complaint  Patient presents with  . Hypertension   (Consider location/radiation/quality/duration/timing/severity/associated sxs/prior Treatment) Patient has had some elevated blood pressure readings.  Patient states she was concerned and wanted to get it checked out.   The history is provided by the patient.  Hypertension  This is a new problem. The problem occurs constantly. Nothing aggravates the symptoms. Nothing relieves the symptoms. She has tried nothing for the symptoms.    History reviewed. No pertinent past medical history. History reviewed. No pertinent surgical history. Family History  Problem Relation Age of Onset  . Hypertension Mother   . Lupus Sister    Social History  Substance Use Topics  . Smoking status: Current Every Day Smoker    Packs/day: 0.25    Types: Cigarettes  . Smokeless tobacco: Never Used  . Alcohol use Yes     Comment: occa   OB History    No data available     Review of Systems  Constitutional: Negative.   HENT: Negative.   Eyes: Negative.   Respiratory: Negative.   Cardiovascular: Negative.   Gastrointestinal: Negative.   Endocrine: Negative.   Genitourinary: Negative.   Musculoskeletal: Negative.   Allergic/Immunologic: Negative.   Neurological: Negative.   Hematological: Negative.   Psychiatric/Behavioral: Negative.     Allergies  Peanut-containing drug products; Mustard seed; Shellfish allergy; Tomato; and Penicillins  Home Medications   Prior to Admission medications   Medication Sig Start Date End Date Taking? Authorizing Provider  benzonatate (TESSALON) 100 MG capsule Take 1 capsule (100 mg total) by mouth every 8 (eight) hours. Patient not taking: Reported on 01/23/2016 12/14/15   Gilda Crease, MD  cephALEXin (KEFLEX) 500 MG capsule Take 1 capsule (500 mg total) by mouth 4 (four) times daily. Patient not taking: Reported on  01/23/2016 07/13/15   Joni Reining Pisciotta, PA-C  clotrimazole (LOTRIMIN) 1 % cream Apply to affected area 2 times daily 01/16/16   Trixie Dredge, PA-C  HYDROcodone-acetaminophen (NORCO/VICODIN) 5-325 MG tablet Take 1-2 tablets by mouth every 6 hours as needed for pain and/or cough. Patient not taking: Reported on 07/18/2015 07/13/15   Joni Reining Pisciotta, PA-C  HYDROcodone-homatropine [facility]) 5-1.5 MG/5ML syrup Take 5 mLs by mouth every 6 (six) hours as needed for cough. Patient not taking: Reported on 01/23/2016 12/14/15   Gilda Crease, MD  ketoconazole (NIZORAL) 2 % cream Apply 1 application topically daily. Apply under breasts and on abdomen. 01/23/16   Arthor Captain, PA-C  metroNIDAZOLE (FLAGYL) 500 MG tablet Take 1 tablet (500 mg total) by mouth 2 (two) times daily. Patient not taking: Reported on 07/13/2015 03/24/14   Marissa Sciacca, PA-C  mineral oil-hydrophilic petrolatum (AQUAPHOR) ointment Apply topically as needed. Apply to hands 01/23/16   Arthor Captain, PA-C  Multiple Vitamins-Minerals (MULTIVITAMIN & MINERAL PO) Take 1 tablet by mouth daily.    Historical Provider, MD  ondansetron (ZOFRAN ODT) 4 MG disintegrating tablet Take 1 tablet (4 mg total) by mouth every 8 (eight) hours as needed for nausea or vomiting. Patient not taking: Reported on 01/23/2016 07/18/15   Barrett Henle, PA-C  permethrin (ELIMITE) 5 % cream Apply to entire body other than face - let sit for 14 hours then wash off, may repeat in 1 week if still having symptoms 01/23/16   Arthor Captain, PA-C  predniSONE (DELTASONE) 20 MG tablet Take 2 tablets (40 mg total) by mouth daily  with breakfast. Patient not taking: Reported on 01/23/2016 12/14/15   Gilda Creasehristopher J Pollina, MD  triamcinolone ointment (KENALOG) 0.5 % Apply 1 application topically 2 (two) times daily. Apply to hands only. 01/23/16   Arthor CaptainAbigail Harris, PA-C  trimethoprim-polymyxin b (POLYTRIM) ophthalmic solution Place 1 drop into the right eye every 4 (four)  hours. Patient not taking: Reported on 07/18/2015 07/10/15   Chase PicketJaime Pilcher Ward, PA-C   Meds Ordered and Administered this Visit  Medications - No data to display  BP 135/82 (BP Location: Right Arm)   Pulse 82   Temp 98.5 F (36.9 C) (Oral)   Resp 14   LMP 08/18/2016 (Exact Date)   SpO2 100%  No data found.   Physical Exam  Constitutional: She is oriented to person, place, and time. She appears well-developed and well-nourished.  HENT:  Head: Normocephalic and atraumatic.  Right Ear: External ear normal.  Left Ear: External ear normal.  Mouth/Throat: Oropharynx is clear and moist.  Eyes: Conjunctivae and EOM are normal. Pupils are equal, round, and reactive to light.  Neck: Normal range of motion. Neck supple.  Cardiovascular: Normal rate, regular rhythm and normal heart sounds.   Pulmonary/Chest: Effort normal and breath sounds normal.  Abdominal: Soft. Bowel sounds are normal.  Neurological: She is alert and oriented to person, place, and time.  Nursing note and vitals reviewed.   Urgent Care Course     Procedures (including critical care time)  Labs Review Labs Reviewed - No data to display  Imaging Review No results found.   Visual Acuity Review  Right Eye Distance:   Left Eye Distance:   Bilateral Distance:    Right Eye Near:   Left Eye Near:    Bilateral Near:         MDM   1. Elevated blood pressure reading    BP taken manually right arm 130/70 and left 140/80 Reassured patient that bp is okay would not treat with meds.   Advised DASH diet and weight loss.      Deatra CanterWilliam J Mikeala Girdler, FNP 08/24/16 [phone]

## 2016-08-24 NOTE — ED Triage Notes (Signed)
[facility] elevated BP since last night (163/112). No history of Hypertension

## 2016-08-24 NOTE — Discharge Instructions (Signed)
Your blood pressure in left arm was 140/80 and right arm 130/70 and would not recommend any treatment with meds at this time. Limit sodium in diet.  Follow up with Primary Care Provider.

## 2016-12-30 ENCOUNTER — Emergency Department (HOSPITAL_COMMUNITY)
Admission: EM | Admit: 2016-12-30 | Discharge: 2016-12-31 | Disposition: A | Discharge status: Home / Self Care | Payer: Self-pay | Attending: Emergency Medicine | Admitting: Emergency Medicine

## 2016-12-30 ENCOUNTER — Encounter (HOSPITAL_COMMUNITY): Payer: Self-pay | Admitting: Emergency Medicine

## 2016-12-30 DIAGNOSIS — Z79899 Other long term (current) drug therapy: Secondary | ICD-10-CM | POA: Insufficient documentation

## 2016-12-30 DIAGNOSIS — J45909 Unspecified asthma, uncomplicated: Secondary | ICD-10-CM | POA: Insufficient documentation

## 2016-12-30 DIAGNOSIS — F17210 Nicotine dependence, cigarettes, uncomplicated: Secondary | ICD-10-CM | POA: Insufficient documentation

## 2016-12-30 DIAGNOSIS — N760 Acute vaginitis: Secondary | ICD-10-CM | POA: Insufficient documentation

## 2016-12-30 DIAGNOSIS — Z91010 Allergy to peanuts: Secondary | ICD-10-CM | POA: Insufficient documentation

## 2016-12-30 DIAGNOSIS — B9689 Other specified bacterial agents as the cause of diseases classified elsewhere: Secondary | ICD-10-CM | POA: Insufficient documentation

## 2016-12-30 LAB — URINALYSIS, ROUTINE W REFLEX MICROSCOPIC
BACTERIA UA: NONE SEEN
Bilirubin Urine: NEGATIVE
Glucose, UA: NEGATIVE mg/dL
Ketones, ur: NEGATIVE mg/dL
Leukocytes, UA: NEGATIVE
NITRITE: NEGATIVE
PH: 5 (ref 5.0–8.0)
Protein, ur: NEGATIVE mg/dL
SPECIFIC GRAVITY, URINE: 1.017 (ref 1.005–1.030)

## 2016-12-30 LAB — PREGNANCY, URINE: PREG TEST UR: NEGATIVE

## 2016-12-30 LAB — WET PREP, GENITAL
Sperm: NONE SEEN
Trich, Wet Prep: NONE SEEN
YEAST WET PREP: NONE SEEN

## 2016-12-30 MED ORDER — METRONIDAZOLE 0.75 % VA GEL: 1.0000 | Freq: Two times a day (BID) | VAGINAL | 0 refills | Status: DC

## 2016-12-30 NOTE — Discharge Instructions (Signed)
Use MetroGel as prescribed. Follow up on the results of your STD tests. You may return to the emergency department, as needed, for new or concerning symptoms.

## 2016-12-30 NOTE — ED Triage Notes (Signed)
Pt reports having vaginal discharge that began today. Pt states color is off-white with odor. Pt reports last menstrual period on July 11 and had intercourse last week.

## 2016-12-30 NOTE — ED Provider Notes (Signed)
[facility]-EMERGENCY DEPT Provider Note   CSN: [id number] Arrival date & time: 12/30/16  2052     History   Chief Complaint Chief Complaint  Patient presents with  . Vaginal Discharge    HPI Monica Douglas is a 26 y.o. female.  26 year old female with a history of asthma presents to the emergency department for complaint of vaginal discharge. Patient states that discharge began today and is off-white in color with a foul odor. She notes intercourse with a new sexual partner one week ago. She has tried douching without relief of symptoms. No associated fever, nausea, vomiting, abdominal pain, dysuria, hematuria. Patient denies concern for pregnancy. Last menstrual period 12/20/2016.   The history is provided by the patient. No language interpreter was used.  Vaginal Discharge      History reviewed. No pertinent past medical history.  Patient Active Problem List   Diagnosis Date Noted  . OBESITY, NOS 08/09/2006  . ASTHMA, UNSPECIFIED 08/09/2006    History reviewed. No pertinent surgical history.  OB History    No data available       Home Medications    Prior to Admission medications   Medication Sig Start Date End Date Taking? Authorizing Provider  clotrimazole (LOTRIMIN) 1 % cream Apply to affected area 2 times daily 01/16/16   West, Emily, PA-C  ketoconazole (NIZORAL) 2 % cream Apply 1 application topically daily. Apply under breasts and on abdomen. 01/23/16   Harris, Cammy Copa, PA-C  metroNIDAZOLE (METROGEL VAGINAL) 0.75 % vaginal gel Place 1 Applicatorful vaginally 2 (two) times daily. Use as directed for 1 week 12/30/16   Antony Madura, PA-C  mineral oil-hydrophilic petrolatum (AQUAPHOR) ointment Apply topically as needed. Apply to hands 01/23/16   Arthor Captain, PA-C  Multiple Vitamins-Minerals (MULTIVITAMIN & MINERAL PO) Take 1 tablet by mouth daily.    [provider]  permethrin (ELIMITE) 5 % cream Apply to entire body other than face - let sit for 14 hours  then wash off, may repeat in 1 week if still having symptoms 01/23/16   Arthor Captain, PA-C  predniSONE (DELTASONE) 20 MG tablet Take 2 tablets (40 mg total) by mouth daily with breakfast. Patient not taking: Reported on 01/23/2016 12/14/15   Gilda Crease, MD  triamcinolone ointment (KENALOG) 0.5 % Apply 1 application topically 2 (two) times daily. Apply to hands only. 01/23/16   Arthor Captain, PA-C    Family History Family History  Problem Relation Age of Onset  . Hypertension Mother   . Lupus Sister     Social History Social History  Substance Use Topics  . Smoking status: Current Every Day Smoker    Packs/day: 0.25    Types: Cigarettes  . Smokeless tobacco: Never Used  . Alcohol use Yes     Comment: occa     Allergies   Peanut-containing drug products; Mustard seed; Shellfish allergy; Tomato; and Penicillins   Review of Systems Review of Systems  Genitourinary: Positive for vaginal discharge.  Ten systems reviewed and are negative for acute change, except as noted in the HPI.    Physical Exam Updated Vital Signs BP (!) 156/103 (BP Location: Right Arm)   Pulse 80   Temp 98.8 F (37.1 C) (Oral)   Resp 14   Ht 5\' 5"  (1.651 m)   Wt 101.2 kg (223 lb)   LMP 12/20/2016   SpO2 100%   BMI 37.11 kg/m   Physical Exam  Constitutional: She is oriented to person, place, and time. She appears well-developed  and well-nourished. No distress.  Nontoxic and in NAD  HENT:  Head: Normocephalic and atraumatic.  Eyes: Conjunctivae and EOM are normal. No scleral icterus.  Neck: Normal range of motion.  Pulmonary/Chest: Effort normal. No respiratory distress.  Respirations even and unlabored  Genitourinary: There is no rash, tenderness, lesion or injury on the right labia. There is no rash, tenderness, lesion or injury on the left labia. Cervix exhibits no motion tenderness and no friability. Vaginal discharge (scant, pale white/clear) found.  Musculoskeletal: Normal  range of motion.  Neurological: She is alert and oriented to person, place, and time. She exhibits normal muscle tone. Coordination normal.  Ambulatory with steady gait.  Skin: Skin is warm and dry. No rash noted. She is not diaphoretic. No erythema. No pallor.  Psychiatric: She has a normal mood and affect. Her behavior is normal.  Nursing note and vitals reviewed.    ED Treatments / Results  Labs (all labs ordered are listed, but only abnormal results are displayed) Labs Reviewed  WET PREP, GENITAL - Abnormal; Notable for the following:       Result Value   Clue Cells Wet Prep HPF POC PRESENT (*)    WBC, Wet Prep HPF POC FEW (*)    All other components within normal limits  URINALYSIS, ROUTINE W REFLEX MICROSCOPIC - Abnormal; Notable for the following:    Hgb urine dipstick MODERATE (*)    Squamous Epithelial / LPF 0-5 (*)    All other components within normal limits  PREGNANCY, URINE  GC/CHLAMYDIA PROBE AMP ([place]) NOT [facility]    EKG  EKG Interpretation None       Radiology No results found.  Procedures Procedures (including critical care time)  Medications Ordered in ED Medications - No data to display   Initial Impression / Assessment and Plan / ED Course  I have reviewed the triage vital signs and the nursing notes.  Pertinent labs & imaging results that were available during my care of the patient were reviewed by me and considered in my medical decision making (see chart for details).     26 year old female presents for vaginal discharge. Workup today reveals bacterial vaginosis. Few white blood cells on wet prep. Physical exam reassuring. Lower suspicion for sexual transmitted infection at this time. I have discussed management with MetroGel. Patient advised to follow-up on the results of her STD tests in 48 hours. Return precautions discussed and provided. Patient discharged in stable condition with no unaddressed concerns.   Final Clinical  Impressions(s) / ED Diagnoses   Final diagnoses:  BV (bacterial vaginosis)    New Prescriptions New Prescriptions   METRONIDAZOLE (METROGEL VAGINAL) 0.75 % VAGINAL GEL    Place 1 Applicatorful vaginally 2 (two) times daily. Use as directed for 1 week     Antony MaduraHumes, Ashrita Chrismer, Cordelia Poche-C 12/30/16 2313    Mancel BaleWentz, Elliott, MD 12/30/16 2322

## 2017-01-01 LAB — GC/CHLAMYDIA PROBE AMP (~~LOC~~) NOT AT ARMC
CHLAMYDIA, DNA PROBE: NEGATIVE
NEISSERIA GONORRHEA: NEGATIVE

## 2017-02-24 ENCOUNTER — Encounter (HOSPITAL_COMMUNITY): Payer: Self-pay | Admitting: Nurse Practitioner

## 2017-02-24 ENCOUNTER — Emergency Department (HOSPITAL_COMMUNITY)
Admission: EM | Admit: 2017-02-24 | Discharge: 2017-02-24 | Disposition: A | Discharge status: Home / Self Care | Payer: Self-pay | Attending: Emergency Medicine | Admitting: Emergency Medicine

## 2017-02-24 DIAGNOSIS — Z720 Tobacco use: Secondary | ICD-10-CM

## 2017-02-24 DIAGNOSIS — Z91010 Allergy to peanuts: Secondary | ICD-10-CM | POA: Insufficient documentation

## 2017-02-24 DIAGNOSIS — J069 Acute upper respiratory infection, unspecified: Secondary | ICD-10-CM | POA: Insufficient documentation

## 2017-02-24 DIAGNOSIS — F17210 Nicotine dependence, cigarettes, uncomplicated: Secondary | ICD-10-CM | POA: Insufficient documentation

## 2017-02-24 DIAGNOSIS — J45909 Unspecified asthma, uncomplicated: Secondary | ICD-10-CM | POA: Insufficient documentation

## 2017-02-24 LAB — RAPID STREP SCREEN (MED CTR MEBANE ONLY): Streptococcus, Group A Screen (Direct): NEGATIVE

## 2017-02-24 MED ORDER — AEROCHAMBER PLUS FLO-VU MEDIUM MISC
1.0000 | Freq: Once | Status: AC
  Administered 2017-02-24: 1
  Filled 2017-02-24: qty 1

## 2017-02-24 MED ORDER — ALBUTEROL SULFATE HFA 108 (90 BASE) MCG/ACT IN AERS
1.0000 | INHALATION_SPRAY | RESPIRATORY_TRACT | Status: DC | PRN
  Administered 2017-02-24: 2 via RESPIRATORY_TRACT
  Filled 2017-02-24: qty 6.7

## 2017-02-24 MED ORDER — KETOROLAC TROMETHAMINE 30 MG/ML IJ SOLN
30.0000 mg | Freq: Once | INTRAMUSCULAR | Status: AC
  Administered 2017-02-24: 30 mg via INTRAMUSCULAR
  Filled 2017-02-24: qty 1

## 2017-02-24 MED ORDER — IBUPROFEN 600 MG PO TABS: 600.0000 mg | ORAL_TABLET | Freq: Four times a day (QID) | ORAL | 0 refills | Status: DC | PRN

## 2017-02-24 MED ORDER — OXYMETAZOLINE HCL 0.05 % NA SOLN
1.0000 | Freq: Once | NASAL | Status: AC
  Administered 2017-02-24: 1 via NASAL
  Filled 2017-02-24: qty 15

## 2017-02-24 MED ORDER — DEXAMETHASONE SODIUM PHOSPHATE 10 MG/ML IJ SOLN
10.0000 mg | Freq: Once | INTRAMUSCULAR | Status: AC
  Administered 2017-02-24: 10 mg via INTRAMUSCULAR
  Filled 2017-02-24: qty 1

## 2017-02-24 MED ORDER — HYDROCODONE-ACETAMINOPHEN 5-325 MG PO TABS: 1.0000 | ORAL_TABLET | ORAL | 0 refills | Status: DC | PRN

## 2017-02-24 NOTE — ED Triage Notes (Signed)
Patient presents with c/o generalized body aches, cough which she reports coughing of a small amount of yellowish colored sputum, and fever. She does not know how high her temperature is but she reports continuous chills and sweats. She also reports a sore throat. She states she took Tylenol cold and sinus last night without much relief. She is A&O x4 and ambulatory. Denies any shortness of breath. Able to ambulate to room without difficulty but some discomfort from generalized aches.

## 2017-02-24 NOTE — ED Provider Notes (Signed)
[facility] DEPT Provider Note   CSN: [id number] Arrival date & time: 02/24/17  1046     History   Chief Complaint Chief Complaint  Patient presents with  . Generalized Body Aches  . Cough    HPI Monica Douglas is a 26 y.o. female.  Pt presents to the ED today with body aches, sore throat, cough.  She did take some tylenol last night.  Nothing today.  ? Fever at home.        History reviewed. No pertinent past medical history.  Patient Active Problem List   Diagnosis Date Noted  . OBESITY, NOS 08/09/2006  . ASTHMA, UNSPECIFIED 08/09/2006    History reviewed. No pertinent surgical history.  OB History    No data available       Home Medications    Prior to Admission medications   Medication Sig Start Date End Date Taking? Authorizing Provider  DM-Phenylephrine-Acetaminophen (TYLENOL COLD MAX) 10-5-325 MG/15ML LIQD Take 15 mLs by mouth 2 (two) times daily as needed.   Yes [provider]  Iron-FA-B Cmp-C-Biot-Probiotic (FUSION PLUS PO) Take 1 capsule by mouth daily.   Yes [provider]  HYDROcodone-acetaminophen (NORCO/VICODIN) 5-325 MG tablet Take 1 tablet by mouth every 4 (four) hours as needed. 02/24/17   Jacalyn Lefevre, MD  ibuprofen (ADVIL,MOTRIN) 600 MG tablet Take 1 tablet (600 mg total) by mouth every 6 (six) hours as needed. 02/24/17   Jacalyn Lefevre, MD    Family History Family History  Problem Relation Age of Onset  . Hypertension Mother   . Lupus Sister     Social History Social History  Substance Use Topics  . Smoking status: Current Every Day Smoker    Packs/day: 0.25    Types: Cigarettes  . Smokeless tobacco: Never Used  . Alcohol use Yes     Comment: occa     Allergies   Peanut-containing drug products; Mustard seed; Shellfish allergy; Tomato; and Penicillins   Review of Systems Review of Systems  Constitutional: Positive for fever.  HENT: Positive for ear pain, sinus pressure and sore throat.     Respiratory: Positive for cough.   All other systems reviewed and are negative.    Physical Exam Updated Vital Signs BP (!) 140/93   Pulse 84   Temp 98.2 F (36.8 C)   Resp 17   Ht  (1.651 m)   Wt 102.1 kg (225 lb)   SpO2 99%   BMI 37.44 kg/m   Physical Exam  Constitutional: She is oriented to person, place, and time. She appears well-developed and well-nourished.  HENT:  Head: Normocephalic and atraumatic.  Right Ear: External ear normal.  Left Ear: External ear normal.  Nose: Nose normal.  Mouth/Throat: Oropharynx is clear and moist.  Eyes: Pupils are equal, round, and reactive to light. Conjunctivae and EOM are normal.  Neck: Normal range of motion. Neck supple.  Cardiovascular: Normal rate, regular rhythm, normal heart sounds and intact distal pulses.   Pulmonary/Chest: Effort normal. She has wheezes.  Abdominal: Soft. Bowel sounds are normal.  Musculoskeletal: Normal range of motion.  Neurological: She is alert and oriented to person, place, and time.  Skin: Skin is warm.  Psychiatric: She has a normal mood and affect. Her behavior is normal. Judgment and thought content normal.  Nursing note and vitals reviewed.    ED Treatments / Results  Labs (all labs ordered are listed, but only abnormal results are displayed) Labs Reviewed  RAPID STREP SCREEN (NOT AT  [facility])  CULTURE, GROUP A STREP [facility])    EKG  EKG Interpretation None       Radiology No results found.  Procedures Procedures (including critical care time)  Medications Ordered in ED Medications  albuterol (PROVENTIL HFA;VENTOLIN HFA) 108 (90 Base) MCG/ACT inhaler 1-2 puff (2 puffs Inhalation Given 02/24/17 1124)  ketorolac (TORADOL) 30 MG/ML injection 30 mg (30 mg Intramuscular Given 02/24/17 1112)  dexamethasone (DECADRON) injection 10 mg (10 mg Intramuscular Given 02/24/17 1111)  oxymetazoline (AFRIN) 0.05 % nasal spray 1 spray (1 spray Each Nare Given 02/24/17 1112)  AEROCHAMBER PLUS  FLO-VU MEDIUM MISC 1 each (1 each Other Given 02/24/17 1125)     Initial Impression / Assessment and Plan / ED Course  I have reviewed the triage vital signs and the nursing notes.  Pertinent labs & imaging results that were available during my care of the patient were reviewed by me and considered in my medical decision making (see chart for details).    Pt is feeling much better.  She is encouraged to try to stop smoking.  She knows to return if worse.  F/u with pcp.  Final Clinical Impressions(s) / ED Diagnoses   Final diagnoses:  Viral upper respiratory tract infection  Tobacco abuse    New Prescriptions New Prescriptions   HYDROCODONE-ACETAMINOPHEN (NORCO/VICODIN) 5-325 MG TABLET    Take 1 tablet by mouth every 4 (four) hours as needed.   IBUPROFEN (ADVIL,MOTRIN) 600 MG TABLET    Take 1 tablet (600 mg total) by mouth every 6 (six) hours as needed.     Jacalyn Lefevre, MD 02/24/17 1154

## 2017-02-24 NOTE — Discharge Instructions (Addendum)
Try to stop smoking. °

## 2017-02-26 ENCOUNTER — Emergency Department (HOSPITAL_COMMUNITY)
Admission: EM | Admit: 2017-02-26 | Discharge: 2017-02-26 | Disposition: A | Discharge status: Home / Self Care | Payer: Self-pay | Attending: Emergency Medicine | Admitting: Emergency Medicine

## 2017-02-26 ENCOUNTER — Encounter (HOSPITAL_COMMUNITY): Payer: Self-pay

## 2017-02-26 DIAGNOSIS — F17210 Nicotine dependence, cigarettes, uncomplicated: Secondary | ICD-10-CM | POA: Insufficient documentation

## 2017-02-26 DIAGNOSIS — J069 Acute upper respiratory infection, unspecified: Secondary | ICD-10-CM | POA: Insufficient documentation

## 2017-02-26 DIAGNOSIS — J45909 Unspecified asthma, uncomplicated: Secondary | ICD-10-CM | POA: Insufficient documentation

## 2017-02-26 DIAGNOSIS — Z91010 Allergy to peanuts: Secondary | ICD-10-CM | POA: Insufficient documentation

## 2017-02-26 DIAGNOSIS — Z79899 Other long term (current) drug therapy: Secondary | ICD-10-CM | POA: Insufficient documentation

## 2017-02-26 MED ORDER — MOMETASONE FUROATE 50 MCG/ACT NA SUSP: 2.0000 | Freq: Every day | NASAL | 12 refills | Status: DC

## 2017-02-26 NOTE — ED Triage Notes (Signed)
Patient c/o headache, sinus pressure, sore throat, bilateral ear pain x 4 days. Patient states she was seen in the ED 3 days ago and feels no better.

## 2017-02-26 NOTE — ED Provider Notes (Signed)
[facility] Provider Note   CSN: [id number] Arrival date & time: 02/26/17  0715     History   Chief Complaint Chief Complaint  Patient presents with  . Headache  . Otalgia  . Sore Throat    HPI ROSIA SYME is a 26 y.o. female.  26 year old female presents with several days of URI symptoms consisting of nasal congestion as well as postnasal drip. Denies any fever or chills. No vomiting or diarrhea. No cough or dyspnea. Has had a sore throat but was seen here several days ago and had a negative strep test. Does admit to smoking cigarettes daily. Has not used any medications prior to arrival.      History reviewed. No pertinent past medical history.  Patient Active Problem List   Diagnosis Date Noted  . OBESITY, NOS 08/09/2006  . ASTHMA, UNSPECIFIED 08/09/2006    History reviewed. No pertinent surgical history.  OB History    No data available       Home Medications    Prior to Admission medications   Medication Sig Start Date End Date Taking? Authorizing Provider  DM-Phenylephrine-Acetaminophen (TYLENOL COLD MAX) 10-5-325 MG/15ML LIQD Take 15 mLs by mouth 2 (two) times daily as needed.    [provider]  HYDROcodone-acetaminophen (NORCO/VICODIN) 5-325 MG tablet Take 1 tablet by mouth every 4 (four) hours as needed. 02/24/17   Jacalyn Lefevre, MD  ibuprofen (ADVIL,MOTRIN) 600 MG tablet Take 1 tablet (600 mg total) by mouth every 6 (six) hours as needed. 02/24/17   Jacalyn Lefevre, MD  Iron-FA-B Cmp-C-Biot-Probiotic (FUSION PLUS PO) Take 1 capsule by mouth daily.    [provider]    Family History Family History  Problem Relation Age of Onset  . Hypertension Mother   . Lupus Sister     Social History Social History  Substance Use Topics  . Smoking status: Current Every Day Smoker    Packs/day: 0.25    Types: Cigarettes  . Smokeless tobacco: Never Used  . Alcohol use Yes     Comment: occa     Allergies     Peanut-containing drug products; Mustard seed; Shellfish allergy; Tomato; and Penicillins   Review of Systems Review of Systems  All other systems reviewed and are negative.    Physical Exam Updated Vital Signs BP (!) 149/93   Pulse 83   Temp 98.2 F (36.8 C) (Oral)   Resp 15   Ht 1.651 m ( )   Wt 102.1 kg (225 lb)   LMP 02/05/2017   SpO2 97%   BMI 37.44 kg/m   Physical Exam  Constitutional: She is oriented to person, place, and time. She appears well-developed and well-nourished.  Non-toxic appearance. No distress.  HENT:  Head: Normocephalic and atraumatic.  Eyes: Pupils are equal, round, and reactive to light. Conjunctivae, EOM and lids are normal.  Neck: Normal range of motion. Neck supple. No tracheal deviation present. No thyroid mass present.  Cardiovascular: Normal rate, regular rhythm and normal heart sounds.  Exam reveals no gallop.   No murmur heard. Pulmonary/Chest: Effort normal and breath sounds normal. No stridor. No respiratory distress. She has no decreased breath sounds. She has no wheezes. She has no rhonchi. She has no rales.  Abdominal: Soft. Normal appearance and bowel sounds are normal. She exhibits no distension. There is no tenderness. There is no rebound and no CVA tenderness.  Musculoskeletal: Normal range of motion. She exhibits no edema or tenderness.  Neurological: She is alert and oriented  to person, place, and time. She has normal strength. No cranial nerve deficit or sensory deficit. GCS eye subscore is 4. GCS verbal subscore is 5. GCS motor subscore is 6.  Skin: Skin is warm and dry. No abrasion and no rash noted.  Psychiatric: She has a normal mood and affect. Her speech is normal and behavior is normal.  Nursing note and vitals reviewed.    ED Treatments / Results  Labs (all labs ordered are listed, but only abnormal results are displayed) Labs Reviewed - No data to display  EKG  EKG Interpretation None       Radiology No  results found.  Procedures Procedures (including critical care time)  Medications Ordered in ED Medications - No data to display   Initial Impression / Assessment and Plan / ED Course  I have reviewed the triage vital signs and the nursing notes.  Pertinent labs & imaging results that were available during my care of the patient were reviewed by me and considered in my medical decision making (see chart for details).     Patient with likely URI. She is afebrile. Prescribed Nasonex  Final Clinical Impressions(s) / ED Diagnoses   Final diagnoses:  None    New Prescriptions New Prescriptions   No medications on file     Lorre Nick, MD 02/26/17 [phone]

## 2017-02-27 LAB — CULTURE, GROUP A STREP (THRC)

## 2017-04-20 ENCOUNTER — Emergency Department (HOSPITAL_COMMUNITY): Payer: Self-pay

## 2017-04-20 ENCOUNTER — Other Ambulatory Visit: Payer: Self-pay

## 2017-04-20 ENCOUNTER — Encounter (HOSPITAL_COMMUNITY): Payer: Self-pay | Admitting: Emergency Medicine

## 2017-04-20 ENCOUNTER — Emergency Department (HOSPITAL_COMMUNITY)
Admission: EM | Admit: 2017-04-20 | Discharge: 2017-04-20 | Disposition: A | Discharge status: Home / Self Care | Payer: Self-pay | Attending: Emergency Medicine | Admitting: Emergency Medicine

## 2017-04-20 DIAGNOSIS — J45909 Unspecified asthma, uncomplicated: Secondary | ICD-10-CM | POA: Insufficient documentation

## 2017-04-20 DIAGNOSIS — Y999 Unspecified external cause status: Secondary | ICD-10-CM | POA: Insufficient documentation

## 2017-04-20 DIAGNOSIS — M542 Cervicalgia: Secondary | ICD-10-CM | POA: Insufficient documentation

## 2017-04-20 DIAGNOSIS — Y92410 Unspecified street and highway as the place of occurrence of the external cause: Secondary | ICD-10-CM | POA: Insufficient documentation

## 2017-04-20 DIAGNOSIS — Z91010 Allergy to peanuts: Secondary | ICD-10-CM | POA: Insufficient documentation

## 2017-04-20 DIAGNOSIS — Z79899 Other long term (current) drug therapy: Secondary | ICD-10-CM | POA: Insufficient documentation

## 2017-04-20 DIAGNOSIS — Y9389 Activity, other specified: Secondary | ICD-10-CM | POA: Insufficient documentation

## 2017-04-20 DIAGNOSIS — F17210 Nicotine dependence, cigarettes, uncomplicated: Secondary | ICD-10-CM | POA: Insufficient documentation

## 2017-04-20 HISTORY — DX: Headache: R51

## 2017-04-20 HISTORY — DX: Unspecified asthma, uncomplicated: J45.909

## 2017-04-20 HISTORY — DX: Headache, unspecified: R51.9

## 2017-04-20 MED ORDER — METHOCARBAMOL 500 MG PO TABS: 1000.0000 mg | ORAL_TABLET | Freq: Four times a day (QID) | ORAL | 0 refills | Status: DC | PRN

## 2017-04-20 MED ORDER — NAPROXEN 250 MG PO TABS: 250.0000 mg | ORAL_TABLET | Freq: Two times a day (BID) | ORAL | 0 refills | Status: DC | PRN

## 2017-04-20 NOTE — ED Notes (Signed)
Pt transported to CT ?

## 2017-04-20 NOTE — Discharge Instructions (Signed)
Take the prescriptions as directed. Also take over the counter tylenol, as directed on packaging, as needed for discomfort. Apply moist heat or ice to the area(s) of discomfort, for 15 minutes at a time, several times per day for the next few days.  Do not fall asleep on a heating or ice pack.  Call your regular medical doctor on Monday to schedule a follow up appointment this week.  Return to the Emergency Department immediately if worsening.
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
## 2017-04-20 NOTE — ED Notes (Signed)
Bed: [facility] Expected date:  Expected time:  Means of arrival:  Comments: 

## 2017-11-02 ENCOUNTER — Other Ambulatory Visit: Payer: Self-pay | Admitting: Family Medicine

## 2017-11-02 DIAGNOSIS — R102 Pelvic and perineal pain: Secondary | ICD-10-CM

## 2017-11-13 ENCOUNTER — Inpatient Hospital Stay: Admission: RE | Admit: 2017-11-13 | Payer: Self-pay | Source: Ambulatory Visit

## 2017-12-10 ENCOUNTER — Other Ambulatory Visit: Payer: Self-pay

## 2017-12-10 ENCOUNTER — Emergency Department (HOSPITAL_COMMUNITY)
Admission: EM | Admit: 2017-12-10 | Discharge: 2017-12-10 | Disposition: A | Discharge status: Home / Self Care | Payer: BLUE CROSS/BLUE SHIELD | Attending: Emergency Medicine | Admitting: Emergency Medicine

## 2017-12-10 ENCOUNTER — Encounter (HOSPITAL_COMMUNITY): Payer: Self-pay | Admitting: Obstetrics and Gynecology

## 2017-12-10 DIAGNOSIS — R112 Nausea with vomiting, unspecified: Secondary | ICD-10-CM | POA: Diagnosis present

## 2017-12-10 DIAGNOSIS — J45909 Unspecified asthma, uncomplicated: Secondary | ICD-10-CM | POA: Diagnosis not present

## 2017-12-10 DIAGNOSIS — F17210 Nicotine dependence, cigarettes, uncomplicated: Secondary | ICD-10-CM | POA: Diagnosis not present

## 2017-12-10 DIAGNOSIS — Z91010 Allergy to peanuts: Secondary | ICD-10-CM | POA: Insufficient documentation

## 2017-12-10 DIAGNOSIS — R197 Diarrhea, unspecified: Secondary | ICD-10-CM | POA: Diagnosis not present

## 2017-12-10 LAB — URINALYSIS, ROUTINE W REFLEX MICROSCOPIC
BACTERIA UA: NONE SEEN
BILIRUBIN URINE: NEGATIVE
GLUCOSE, UA: NEGATIVE mg/dL
KETONES UR: 5 mg/dL — AB
Leukocytes, UA: NEGATIVE
NITRITE: NEGATIVE
PH: 5 (ref 5.0–8.0)
PROTEIN: NEGATIVE mg/dL
Specific Gravity, Urine: 1.029 (ref 1.005–1.030)

## 2017-12-10 LAB — COMPREHENSIVE METABOLIC PANEL
ALBUMIN: 4 g/dL (ref 3.5–5.0)
ALT: 18 U/L (ref 0–44)
AST: 20 U/L (ref 15–41)
Alkaline Phosphatase: 57 U/L (ref 38–126)
Anion gap: 11 (ref 5–15)
BILIRUBIN TOTAL: 0.6 mg/dL (ref 0.3–1.2)
BUN: 5 mg/dL — AB (ref 6–20)
CO2: 24 mmol/L (ref 22–32)
Calcium: 9.5 mg/dL (ref 8.9–10.3)
Chloride: 104 mmol/L (ref 98–111)
Creatinine, Ser: 0.72 mg/dL (ref 0.44–1.00)
GFR calc Af Amer: >60 mL/min (ref >60)
GFR calc non Af Amer: >60 mL/min (ref >60)
GLUCOSE: 99 mg/dL (ref 70–99)
POTASSIUM: 3.3 mmol/L — AB (ref 3.5–5.1)
Sodium: 139 mmol/L (ref 135–145)
TOTAL PROTEIN: 8 g/dL (ref 6.5–8.1)

## 2017-12-10 LAB — CBC
HEMATOCRIT: 33.7 % — AB (ref 36.0–46.0)
HEMOGLOBIN: 10.8 g/dL — AB (ref 12.0–15.0)
MCH: 25.1 pg — ABNORMAL LOW (ref 26.0–34.0)
MCHC: 32 g/dL (ref 30.0–36.0)
MCV: 78.2 fL (ref 78.0–100.0)
Platelets: 563 10*3/uL — ABNORMAL HIGH (ref 150–400)
RBC: 4.31 MIL/uL (ref 3.87–5.11)
RDW: 16.1 % — ABNORMAL HIGH (ref 11.5–15.5)
WBC: 11.1 10*3/uL — ABNORMAL HIGH (ref 4.0–10.5)

## 2017-12-10 LAB — WET PREP, GENITAL
Clue Cells Wet Prep HPF POC: NONE SEEN
SPERM: NONE SEEN
Trich, Wet Prep: NONE SEEN
Yeast Wet Prep HPF POC: NONE SEEN

## 2017-12-10 LAB — I-STAT BETA HCG BLOOD, ED (MC, WL, AP ONLY): I-stat hCG, quantitative: <5 m[IU]/mL (ref <5)

## 2017-12-10 LAB — LIPASE, BLOOD: Lipase: 32 U/L (ref 11–51)

## 2017-12-10 MED ORDER — ONDANSETRON 4 MG PO TBDP: 4.0000 mg | ORAL_TABLET | Freq: Three times a day (TID) | ORAL | 0 refills | Status: AC | PRN

## 2017-12-10 MED ORDER — POTASSIUM CHLORIDE CRYS ER 20 MEQ PO TBCR
40.0000 meq | EXTENDED_RELEASE_TABLET | Freq: Once | ORAL | Status: AC
Start: 2017-12-10 — End: 2017-12-10
  Administered 2017-12-10: 40 meq via ORAL
  Filled 2017-12-10: qty 2

## 2017-12-10 MED ORDER — POTASSIUM CHLORIDE CRYS ER 20 MEQ PO TBCR: 20.0000 meq | EXTENDED_RELEASE_TABLET | Freq: Every day | ORAL | 0 refills | Status: AC

## 2017-12-10 MED ORDER — SODIUM CHLORIDE 0.9 % IV BOLUS
1000.0000 mL | Freq: Once | INTRAVENOUS | Status: AC
  Administered 2017-12-10: 1000 mL via INTRAVENOUS

## 2017-12-10 MED ORDER — ONDANSETRON HCL 4 MG/2ML IJ SOLN
4.0000 mg | Freq: Once | INTRAMUSCULAR | Status: AC
  Administered 2017-12-10: 4 mg via INTRAVENOUS
  Filled 2017-12-10: qty 2

## 2017-12-10 NOTE — Discharge Instructions (Signed)
You were seen in the emergency department today for nausea, vomiting, and diarrhea.  Your work-up in the emergency department was reassuring.  Your hemoglobin was slightly low at 10.8, this is similar to previous lab work you have had done.  Your potassium was also slightly low at 3.3, we are giving you a couple days of potassium supplementation to take.  Your physical exam was reassuring.  We suspect this is likely a viral GI illness.  Please follow the diet recommendations in your discharge instructions.  We have also given you a prescription for a few tablets of Zofran, you may take this every 8 hours as needed for nausea and vomiting.We have prescribed you new medication(s) today. Discuss the medications prescribed today with your pharmacist as they can have adverse effects and interactions with your other medicines including over the counter and prescribed medications. Seek medical evaluation if you start to experience new or abnormal symptoms after taking one of these medicines, seek care immediately if you start to experience difficulty breathing, feeling of your throat closing, facial swelling, or rash as these could be indications of a more serious allergic reaction  Follow-up with your primary care provider in 3 to 5 days for reevaluation.  Return to the ER for new or worsening symptoms including but not limited to fever, inability to keep fluids down, blood in your stool or vomit, chest pain, trouble breathing, or any other concerns.

## 2017-12-10 NOTE — ED Provider Notes (Signed)
[facility]-EMERGENCY DEPT Provider Note   CSN: [id number] Arrival date & time: 12/10/17  1809   History   Chief Complaint Chief Complaint  Patient presents with  . Abdominal Pain  . Emesis  . Diarrhea    HPI Monica Douglas is a 27 y.o. female with a hx of tobacco abuse and asthma who presents to the ED with complaints of N/V/D x 1 week. Patient states she is having daily diarrhea, 3-5 episodes per day. She is having nausea and vomiting when eating some of the time. She has been able to keep down some food/fluids recently such as toast but has diarrhea shortly after. No blood in diarrhea or emesis.  Has had some subjective fever and chills.  She states that 4 days ago she also started her menstrual cycle, reports that she developed some R lower abdominal cramping pain that comes and goes and is consistent with previous menstrual cramping (typically on the R), no pain at present, was not having abdominal pain with n/v/d until menstruation started.  No specific alleviating or aggravating factors.  Patient has not tried at home intervention.  Denies dysuria, urinary urgency, urinary frequency, hematemesis, melena, hematochezia, vaginal discharge, chest pain, or dyspnea.  Denies recent foreign travel or hospitalizations. Patient is sexually active in a monogamous relationship, no concern for STDs.  She does not think that she could be pregnant.    HPI  Past Medical History:  Diagnosis Date  . Asthma   . Headache     Patient Active Problem List   Diagnosis Date Noted  . OBESITY, NOS 08/09/2006  . ASTHMA, UNSPECIFIED 08/09/2006    History reviewed. No pertinent surgical history.   OB History   None      Home Medications    Prior to Admission medications   Medication Sig Start Date End Date Taking? Authorizing Provider  HYDROcodone-acetaminophen (NORCO/VICODIN) 5-325 MG tablet Take 1 tablet by mouth every 4 (four) hours as needed. Patient not taking:  Reported on 04/20/2017 02/24/17   Jacalyn LefevreHaviland, Julie, MD  ibuprofen (ADVIL,MOTRIN) 600 MG tablet Take 1 tablet (600 mg total) by mouth every 6 (six) hours as needed. 02/24/17   Jacalyn LefevreHaviland, Julie, MD  methocarbamol (ROBAXIN) 500 MG tablet Take 2 tablets (1,000 mg total) 4 (four) times daily as needed by mouth for muscle spasms (muscle spasm/pain). 04/20/17   Samuel JesterMcManus, Kathleen, DO  mometasone (NASONEX) 50 MCG/ACT nasal spray Place 2 sprays into the nose daily. Patient not taking: Reported on 04/20/2017 02/26/17   Lorre NickAllen, Anthony, MD  naproxen (NAPROSYN) 250 MG tablet Take 1 tablet (250 mg total) 2 (two) times daily as needed by mouth for mild pain or moderate pain (take with food). 04/20/17   Samuel JesterMcManus, Kathleen, DO    Family History Family History  Problem Relation Age of Onset  . Hypertension Mother   . Lupus Sister     Social History Social History   Tobacco Use  . Smoking status: Current Every Day Smoker    Packs/day: 0.25    Types: Cigarettes  . Smokeless tobacco: Never Used  Substance Use Topics  . Alcohol use: Yes    Comment: occa  . Drug use: No     Allergies   Peanut-containing drug products; Mustard seed; Shellfish allergy; Tomato; and Penicillins   Review of Systems Review of Systems  Constitutional: Positive for chills and fever (subjective).  Respiratory: Negative for shortness of breath.   Cardiovascular: Negative for chest pain.  Gastrointestinal: Positive for abdominal pain (  not at present), diarrhea, nausea and vomiting. Negative for blood in stool and constipation.  Genitourinary: Positive for vaginal bleeding (menstruation). Negative for dysuria, frequency, urgency and vaginal discharge.  All other systems reviewed and are negative.    Physical Exam Updated Vital Signs BP (!) 151/99 (BP Location: Right Arm)   Pulse 86   Temp 98.3 F (36.8 C) (Oral)   Resp 18   LMP 12/10/2017   SpO2 98%   Physical Exam  Constitutional: She appears well-developed and  well-nourished. No distress.  HENT:  Head: Normocephalic and atraumatic.  Eyes: Conjunctivae are normal. Right eye exhibits no discharge. Left eye exhibits no discharge.  Cardiovascular: Normal rate and regular rhythm.  No murmur heard. Pulmonary/Chest: Breath sounds normal. No respiratory distress. She has no wheezes. She has no rales.  Abdominal: Soft. She exhibits no distension. There is no tenderness. There is no rigidity, no rebound, no guarding, no CVA tenderness, no tenderness at McBurney's point and negative Murphy's sign.  Genitourinary: Pelvic exam was performed with patient supine. There is no lesion on the right labia. There is no lesion on the left labia. Cervix exhibits discharge (minimal, clear to white). Cervix exhibits no motion tenderness and no friability. Right adnexum displays no mass, no tenderness and no fullness. Left adnexum displays no mass, no tenderness and no fullness. There is bleeding (mild amount) in the vagina. No erythema in the vagina. No foreign body in the vagina.  Neurological: She is alert.  Clear speech.   Skin: Skin is warm and dry. No rash noted.  Psychiatric: She has a normal mood and affect. Her behavior is normal.  Nursing note and vitals reviewed.   ED Treatments / Results  Labs Results for orders placed or performed during the hospital encounter of 12/10/17  Wet prep, genital  Result Value Ref Range   Yeast Wet Prep HPF POC NONE SEEN NONE SEEN   Trich, Wet Prep NONE SEEN NONE SEEN   Clue Cells Wet Prep HPF POC NONE SEEN NONE SEEN   WBC, Wet Prep HPF POC MODERATE (A) NONE SEEN   Sperm NONE SEEN   Lipase, blood  Result Value Ref Range   Lipase 32 11 - 51 U/L  Comprehensive metabolic panel  Result Value Ref Range   Sodium 139 135 - 145 mmol/L   Potassium 3.3 (L) 3.5 - 5.1 mmol/L   Chloride 104 98 - 111 mmol/L   CO2 24 22 - 32 mmol/L   Glucose, Bld 99 70 - 99 mg/dL   BUN 5 (L) 6 - 20 mg/dL   Creatinine, Ser 6.96 0.44 - 1.00 mg/dL    Calcium 9.5 8.9 - 29.5 mg/dL   Total Protein 8.0 6.5 - 8.1 g/dL   Albumin 4.0 3.5 - 5.0 g/dL   AST 20 15 - 41 U/L   ALT 18 0 - 44 U/L   Alkaline Phosphatase 57 38 - 126 U/L   Total Bilirubin 0.6 0.3 - 1.2 mg/dL   GFR calc non Af Amer >60 >60 mL/min   GFR calc Af Amer >60 >60 mL/min   Anion gap 11 5 - 15  CBC  Result Value Ref Range   WBC 11.1 (H) 4.0 - 10.5 K/uL   RBC 4.31 3.87 - 5.11 MIL/uL   Hemoglobin 10.8 (L) 12.0 - 15.0 g/dL   HCT 28.4 (L) 13.2 - 44.0 %   MCV 78.2 78.0 - 100.0 fL   MCH 25.1 (L) 26.0 - 34.0 pg   MCHC 32.0 30.0 -  36.0 g/dL   RDW 16.1 (H) 09.6 - 04.5 %   Platelets 563 (H) 150 - 400 K/uL  Urinalysis, Routine w reflex microscopic  Result Value Ref Range   Color, Urine AMBER (A) YELLOW   APPearance HAZY (A) CLEAR   Specific Gravity, Urine 1.029 1.005 - 1.030   pH 5.0 5.0 - 8.0   Glucose, UA NEGATIVE NEGATIVE mg/dL   Hgb urine dipstick MODERATE (A) NEGATIVE   Bilirubin Urine NEGATIVE NEGATIVE   Ketones, ur 5 (A) NEGATIVE mg/dL   Protein, ur NEGATIVE NEGATIVE mg/dL   Nitrite NEGATIVE NEGATIVE   Leukocytes, UA NEGATIVE NEGATIVE   RBC / HPF >50 (H) 0 - 5 RBC/hpf   WBC, UA 0-5 0 - 5 WBC/hpf   Bacteria, UA NONE SEEN NONE SEEN   Squamous Epithelial / LPF 6-10 0 - 5   Mucus PRESENT    Hyaline Casts, UA PRESENT   I-Stat beta hCG blood, ED  Result Value Ref Range   I-stat hCG, quantitative <5.0 <5 mIU/mL   Comment 3           No results found. EKG None  Radiology No results found.  Procedures Procedures (including critical care time)  Medications Ordered in ED Medications - No data to display   Initial Impression / Assessment and Plan / ED Course  I have reviewed the triage vital signs and the nursing notes.  Pertinent labs & imaging results that were available during my care of the patient were reviewed by me and considered in my medical decision making (see chart for details).   Patient presents to the emergency department with complaints of  nausea, vomiting, and diarrhea x 1 week, she also mentions that she started menstruating as well and is having cramps related to menstruation similar to previous. Patient nontoxic appearing, in no apparent distress, vitals WNL with the exception of initial elevated BP, normalized, doubt HTN emergency. Exam is benign, abdomen non- tender, no peritoneal signs, no cervical motion or adnexal tenderness. Plan for labs, fluids, zofran, re-eval.   Labs reviewed and fairly unremarkable.  Patient has nonspecific leukocytosis at 11.1.  Her hemoglobin is 10.8, fairly consistent with previous.  Creatinine WNL.  Lipase and LFTs WNL.  Patient mildly hypokalemic at 3.3, otherwise electrolytes WNL.  Oral potassium given in the emergency department with a couple of days of supplementation.  Urinalysis appears consistent with history of menstruation, no obvious UTI.  Wet prep is without signs of BV.  Gonorrhea and chlamydia cultures pending, patient declined HIV and syphilis testing, she has low suspicion for STD and would like to await results without tx, feel this is reasonable.  On repeat exam patient does not have a surgical abdomen and there are no peritoneal signs.  No indication of appendicitis, bowel obstruction, bowel perforation, pancreatitis, cholecystitis, diverticulitis, PID or ectopic pregnancy.  Unclear definitive etiology, query viral GI illness.  Patient tolerating p.o. in the emergency department and resting comfortably.  Will discharge with a few tablets of Zofran and with potassium supplement.  Diet recommendations provided. I discussed results, treatment plan, need for PCP follow-up, and return precautions with the patient. Provided opportunity for questions, patient confirmed understanding and is in agreement with plan.   Vitals:   12/10/17 1821 12/10/17 2046  BP: (!) 151/99 134/88  Pulse: 86 79  Resp: 18 20  Temp: 98.3 F (36.8 C) 97.9 F (36.6 C)  SpO2: 98% 98%    Final Clinical Impressions(s) /  ED Diagnoses   Final  diagnoses:  Nausea vomiting and diarrhea    ED Discharge Orders        Ordered    ondansetron (ZOFRAN ODT) 4 MG disintegrating tablet  Every 8 hours PRN     12/10/17 2152    potassium chloride SA (K-DUR,KLOR-CON) 20 MEQ tablet  Daily     12/10/17 2152       Cherly Anderson, PA-C 12/10/17 2313    Gerhard Munch, MD 12/10/17 2318

## 2017-12-10 NOTE — ED Triage Notes (Signed)
Pt reports she has been having abdominal pains, emesis, and diarrhea for the last week. Pt reports she is currently on her menstrual cycle and is not pregnant.  Pt reports she cannot eat a meal without vomiting. Pt denies blood in emesis or stool.

## 2017-12-10 NOTE — ED Notes (Signed)
Pt made aware urine needed  

## 2017-12-11 LAB — GC/CHLAMYDIA PROBE AMP (~~LOC~~) NOT AT ARMC
CHLAMYDIA, DNA PROBE: NEGATIVE
Neisseria Gonorrhea: NEGATIVE
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
## 2019-10-22 ENCOUNTER — Encounter (HOSPITAL_COMMUNITY): Payer: Self-pay

## 2019-10-22 ENCOUNTER — Other Ambulatory Visit: Payer: Self-pay

## 2019-10-22 DIAGNOSIS — R070 Pain in throat: Secondary | ICD-10-CM | POA: Insufficient documentation

## 2019-10-22 DIAGNOSIS — Z5321 Procedure and treatment not carried out due to patient leaving prior to being seen by health care provider: Secondary | ICD-10-CM | POA: Insufficient documentation

## 2019-10-22 NOTE — ED Triage Notes (Signed)
Pt sts an allergic reaction to Bupropion. Been taking for 2 weeks ago to stop smoking. C/o itchy tongue, throat pain and itchy hands. No distress noted.

## 2019-10-23 ENCOUNTER — Encounter (HOSPITAL_COMMUNITY): Payer: Self-pay | Admitting: Emergency Medicine

## 2019-10-23 ENCOUNTER — Emergency Department (HOSPITAL_COMMUNITY)
Admission: EM | Admit: 2019-10-23 | Discharge: 2019-10-23 | Disposition: A | Discharge status: Home / Self Care | Payer: BC Managed Care – PPO | Source: Home / Self Care | Attending: Emergency Medicine | Admitting: Emergency Medicine

## 2019-10-23 ENCOUNTER — Emergency Department (HOSPITAL_COMMUNITY)
Admission: EM | Admit: 2019-10-23 | Discharge: 2019-10-23 | Disposition: A | Discharge status: Home / Self Care | Payer: BC Managed Care – PPO | Attending: Emergency Medicine | Admitting: Emergency Medicine

## 2019-10-23 DIAGNOSIS — T7840XA Allergy, unspecified, initial encounter: Secondary | ICD-10-CM

## 2019-10-23 MED ORDER — PREDNISONE 20 MG PO TABS: ORAL_TABLET | ORAL | 0 refills | Status: AC

## 2019-10-23 MED ORDER — FAMOTIDINE 20 MG PO TABS
20.0000 mg | ORAL_TABLET | Freq: Once | ORAL | Status: AC
Start: 2019-10-23 — End: 2019-10-23
  Administered 2019-10-23: 20 mg via ORAL
  Filled 2019-10-23: qty 1

## 2019-10-23 MED ORDER — DIPHENHYDRAMINE HCL 25 MG PO TABS: 25.0000 mg | ORAL_TABLET | Freq: Four times a day (QID) | ORAL | 0 refills | Status: AC | PRN

## 2019-10-23 MED ORDER — DIPHENHYDRAMINE HCL 25 MG PO CAPS
50.0000 mg | ORAL_CAPSULE | Freq: Once | ORAL | Status: AC
Start: 2019-10-23 — End: 2019-10-23
  Administered 2019-10-23: 50 mg via ORAL
  Filled 2019-10-23: qty 2

## 2019-10-23 MED ORDER — PREDNISONE 20 MG PO TABS
60.0000 mg | ORAL_TABLET | Freq: Once | ORAL | Status: AC
Start: 2019-10-23 — End: 2019-10-23
  Administered 2019-10-23: 60 mg via ORAL
  Filled 2019-10-23: qty 3

## 2019-10-23 NOTE — ED Notes (Addendum)
Pt presents c/o of allergic reaction. Pt states symtpoms began 2 days go. Pt states only new change is she started taking bupropion which was prescribed 3 weeks ago. Pt is c/o palmar itching and redness, numbness of right fourth digit and itchy tongue. Upon assessment there is mild palmar erythema bilaterally, no pharygneal erythema or edema noted, mucus membranes pink and moist, intact dentition, and no swelling of lips or tongue noted. Respiratory assessment WDL. No visible urticaria.

## 2019-10-23 NOTE — ED Provider Notes (Signed)
Monica Douglas Provider Note   CSN: [id number] Arrival date & time: 10/23/19  1551     History Chief Complaint  Patient presents with  . Allergic Reaction    Monica Douglas is a 29 y.o. female.  The history is provided by the patient and medical records. No language interpreter was used.  Allergic Reaction    29 year old female with history of asthma as well as numerous allergies, presenting with concerns of allergic reaction.  For the past 2 days she has experienced tingling sensation to her tongue, burning sensation to both of her hands as well as itchiness, and throat irritation when she swallows.  States that symptoms felt similar to prior allergic reaction to peanut containing products.  She felt the only new exposure is bupropion that she started approximately 3 weeks ago.  No other environmental changes, changes in her body products, new foods, or new pets.  She did take 1 Benadryl last night without relief.  She denies shortness of breath, abdominal cramping, rash, lightheadedness or dizziness.  She reports symptoms of moderate in severity and appears to be progressively worse.  The last time that she took bupropion was yesterday morning.  Past Medical History:  Diagnosis Date  . Asthma   . Headache     Patient Active Problem List   Diagnosis Date Noted  . OBESITY, NOS 08/09/2006  . ASTHMA, UNSPECIFIED 08/09/2006    History reviewed. No pertinent surgical history.   OB History   No obstetric history on file.     Family History  Problem Relation Age of Onset  . Hypertension Mother   . Lupus Sister     Social History   Tobacco Use  . Smoking status: Current Every Day Smoker    Packs/day: 0.25    Types: Cigarettes  . Smokeless tobacco: Never Used  Substance Use Topics  . Alcohol use: Yes    Comment: occa  . Drug use: No    Home Medications Prior to Admission medications   Medication Sig Start Date End Date Taking?  Authorizing Provider  Acetaminophen 325 MG CAPS Take 650 mg by mouth daily as needed for moderate pain.    [provider]  ibuprofen (ADVIL,MOTRIN) 800 MG tablet Take 800 mg by mouth every 6 (six) hours as needed. for pain 12/05/17   [provider]  ondansetron (ZOFRAN ODT) 4 MG disintegrating tablet Take 1 tablet (4 mg total) by mouth every 8 (eight) hours as needed for nausea or vomiting. 12/10/17   Petrucelli, Samantha R, PA-C  potassium chloride SA (K-DUR,KLOR-CON) 20 MEQ tablet Take 1 tablet (20 mEq total) by mouth daily. 12/10/17   Petrucelli, Samantha R, PA-C    Allergies    Peanut-containing drug products, Mustard seed, Other, Peanut oil, Shellfish allergy, Tomato, and Penicillins  Review of Systems   Review of Systems  All other systems reviewed and are negative.   Physical Exam Updated Vital Signs BP 133/89   Pulse 98   Temp 98.7 F (37.1 C) (Oral)   Resp 17   SpO2 100%   Physical Exam Vitals and nursing note reviewed.  Constitutional:      General: She is not in acute distress.    Appearance: She is well-developed. She is obese.  HENT:     Head: Atraumatic.     Mouth/Throat:     Mouth: Mucous membranes are moist.     Pharynx: No posterior oropharyngeal erythema.     Comments: No tongue swelling,  no mucosal swelling. Eyes:     Conjunctiva/sclera: Conjunctivae normal.  Cardiovascular:     Rate and Rhythm: Normal rate and regular rhythm.     Pulses: Normal pulses.     Heart sounds: Normal heart sounds.  Pulmonary:     Effort: Pulmonary effort is normal.     Breath sounds: Normal breath sounds. No wheezing.  Abdominal:     Palpations: Abdomen is soft.     Tenderness: There is no abdominal tenderness.  Musculoskeletal:     Cervical back: Neck supple.  Skin:    Findings: No rash.     Comments: Examinations of bilateral hands without any significant skin changes obvious erythema.  No urticarial rash noted.  Neurological:     Mental Status: She  is alert and oriented to person, place, and time.  Psychiatric:        Mood and Affect: Mood normal.     ED Results / Procedures / Treatments   Labs (all labs ordered are listed, but only abnormal results are displayed) Labs Reviewed - No data to display  EKG None  Radiology No results found.  Procedures Procedures (including critical care time)  Medications Ordered in ED Medications  predniSONE (DELTASONE) tablet 60 mg (60 mg Oral Given 10/23/19 1639)  diphenhydrAMINE (BENADRYL) capsule 50 mg (50 mg Oral Given 10/23/19 1641)  famotidine (PEPCID) tablet 20 mg (20 mg Oral Given 10/23/19 1639)    ED Course  I have reviewed the triage vital signs and the nursing notes.  Pertinent labs & imaging results that were available during my care of the patient were reviewed by me and considered in my medical decision making (see chart for details).    MDM Rules/Calculators/A&P                      BP 133/89   Pulse 98   Temp 98.7 F (37.1 C) (Oral)   Resp 17   SpO2 100%   Final Clinical Impression(s) / ED Diagnoses Final diagnoses:  Allergic reaction, initial encounter    Rx / DC Orders ED Discharge Orders         Ordered    predniSONE (DELTASONE) 20 MG tablet     10/23/19 1743    diphenhydrAMINE (BENADRYL) 25 MG tablet  Every 6 hours PRN     10/23/19 1743         4:38 PM Patient voiced concern for allergic reaction from bupropion with complaints of discomfort in her tongue and her hands and felt similar to prior allergic reaction.  Examination without any obvious physical findings and patient is not in any acute respiratory discomfort.  Due to known history of drug allergies in the past, recommend patient to follow-up with her PCP for medication readjustment.  Will give prednisone, Benadryl, and Pepcid.  Patient otherwise stable for discharge.   Domenic Moras, PA-C 10/23/19 1745    Charlesetta Shanks, MD 10/24/19 [phone]

## 2019-10-23 NOTE — ED Triage Notes (Signed)
Pt reports she is having an allergic reaction to Bupropion that she started 3 weeks ago. Having hand burning and itching, tongue itching, and throat hurts when she swallows x 2 days.

## 2019-10-23 NOTE — Discharge Instructions (Signed)
Please follow up with your doctor for changes to your medication as it may contribute to your allergic reaction.  Return if your symptoms worsen or if you have other concerns.

## 2024-03-03 ENCOUNTER — Emergency Department (HOSPITAL_BASED_OUTPATIENT_CLINIC_OR_DEPARTMENT_OTHER)
Admission: EM | Admit: 2024-03-03 | Discharge: 2024-03-03 | Disposition: A | Discharge status: Home / Self Care | Attending: Emergency Medicine | Admitting: Emergency Medicine

## 2024-03-03 ENCOUNTER — Other Ambulatory Visit (HOSPITAL_BASED_OUTPATIENT_CLINIC_OR_DEPARTMENT_OTHER): Payer: Self-pay

## 2024-03-03 ENCOUNTER — Other Ambulatory Visit: Payer: Self-pay

## 2024-03-03 ENCOUNTER — Encounter (HOSPITAL_BASED_OUTPATIENT_CLINIC_OR_DEPARTMENT_OTHER): Payer: Self-pay | Admitting: Emergency Medicine

## 2024-03-03 DIAGNOSIS — Z91010 Allergy to peanuts: Secondary | ICD-10-CM | POA: Diagnosis not present

## 2024-03-03 DIAGNOSIS — Z79899 Other long term (current) drug therapy: Secondary | ICD-10-CM | POA: Diagnosis not present

## 2024-03-03 DIAGNOSIS — T7840XA Allergy, unspecified, initial encounter: Secondary | ICD-10-CM | POA: Diagnosis present

## 2024-03-03 MED ORDER — EPINEPHRINE 0.3 MG/0.3ML IJ SOAJ
INTRAMUSCULAR | Status: AC
  Filled 2024-03-03: qty 0.3

## 2024-03-03 MED ORDER — DIPHENHYDRAMINE HCL 50 MG/ML IJ SOLN
25.0000 mg | Freq: Once | INTRAMUSCULAR | Status: AC
  Administered 2024-03-03: 25 mg via INTRAVENOUS
  Filled 2024-03-03: qty 1

## 2024-03-03 MED ORDER — EPINEPHRINE 0.3 MG/0.3ML IJ SOAJ: 0.3000 mg | INTRAMUSCULAR | 2 refills | Status: AC | PRN

## 2024-03-03 MED ORDER — FAMOTIDINE IN NACL 20-0.9 MG/50ML-% IV SOLN
20.0000 mg | Freq: Once | INTRAVENOUS | Status: AC
  Administered 2024-03-03: 20 mg via INTRAVENOUS
  Filled 2024-03-03: qty 50

## 2024-03-03 MED ORDER — METHYLPREDNISOLONE SODIUM SUCC 125 MG IJ SOLR
125.0000 mg | INTRAMUSCULAR | Status: AC
  Administered 2024-03-03: 125 mg via INTRAVENOUS
  Filled 2024-03-03: qty 2

## 2024-03-03 MED ORDER — ONDANSETRON HCL 4 MG/2ML IJ SOLN
4.0000 mg | Freq: Once | INTRAMUSCULAR | Status: AC
  Administered 2024-03-03: 4 mg via INTRAVENOUS
  Filled 2024-03-03: qty 2

## 2024-03-03 MED ORDER — EPINEPHRINE 0.3 MG/0.3ML IJ SOAJ
0.3000 mg | INTRAMUSCULAR | 2 refills | Status: DC | PRN
  Filled 2024-03-03: qty 1, fill #0

## 2024-03-03 NOTE — ED Provider Notes (Signed)
  EMERGENCY DEPARTMENT AT [facility] Provider Note   CSN: [id number] Arrival date & time: 03/03/24  1014     Patient presents with: Allergic Reaction   Monica Douglas is a 33 y.o. female.   33 year old female with a history of allergic reactions who presents to the emergency department with concerns for the same.  Patient reports that an hour prior to arrival she ate at a new [employer] bar.  Says that she went out on a walk and started feeling itching in her palms as well as a globus sensation in her mouth and swelling of her left eye.  Did have some sneezing just prior to this as well.  Took some Benadryl  and decided to come in.  She is concerned that the bar may have had peanuts in it and she has had similar reactions in the past.  Never required an EpiPen        Prior to Admission medications   Medication Sig Start Date End Date Taking? Authorizing Provider  amLODipine (NORVASC) 10 MG tablet Take 10 mg by mouth daily. 12/13/23  Yes [provider]  tirzepatide CLOYDE) 7.5 MG/0.5ML Pen Inject 7.5 mg into the skin. 02/25/24  Yes [provider]  valsartan (DIOVAN) 160 MG tablet Take 160 mg by mouth daily. 10/04/23  Yes [provider]  Acetaminophen  325 MG CAPS Take 650 mg by mouth daily as needed for moderate pain.    [provider]  diphenhydrAMINE  (BENADRYL ) 25 MG tablet Take 1 tablet (25 mg total) by mouth every 6 (six) hours as needed for allergies. 10/23/19   Nivia Colon, PA-C  EPINEPHrine  0.3 mg/0.3 mL IJ SOAJ injection Inject 0.3 mg into the muscle as needed for anaphylaxis. 03/03/24   Yolande Lamar BROCKS, MD  ibuprofen  (ADVIL ,MOTRIN ) 800 MG tablet Take 800 mg by mouth every 6 (six) hours as needed. for pain 12/05/17   [provider]  ondansetron  (ZOFRAN  ODT) 4 MG disintegrating tablet Take 1 tablet (4 mg total) by mouth every 8 (eight) hours as needed for nausea or vomiting. 12/10/17   Petrucelli, Samantha R, PA-C   potassium chloride  SA (K-DUR,KLOR-CON ) 20 MEQ tablet Take 1 tablet (20 mEq total) by mouth daily. 12/10/17   Petrucelli, Samantha R, PA-C  predniSONE  (DELTASONE ) 20 MG tablet 2 tabs po daily x 3 days 10/23/19   Nivia Colon, PA-C    Allergies: Peanut-containing drug products, Mustard, Other, Peanut oil, Shellfish allergy, Tomato, and Penicillins    Review of Systems  Updated Vital Signs BP (!) 130/91   Pulse 93   Temp 98.2 F (36.8 C) (Oral)   Resp 18   Ht 5' 5 (1.651 m)   Wt 99.8 kg   SpO2 100%   BMI 36.61 kg/m   Physical Exam Vitals and nursing note reviewed.  Constitutional:      General: She is not in acute distress.    Appearance: She is well-developed.  HENT:     Head: Normocephalic and atraumatic.     Right Ear: External ear normal.     Left Ear: External ear normal.     Nose: Nose normal.     Mouth/Throat:     Mouth: Mucous membranes are moist.     Pharynx: Oropharynx is clear.     Comments: No uvular swelling or tongue swelling Eyes:     Extraocular Movements: Extraocular movements intact.     Pupils: Pupils are equal, round, and reactive to light.  Cardiovascular:     Rate  and Rhythm: Normal rate and regular rhythm.     Heart sounds: No murmur heard. Pulmonary:     Effort: Pulmonary effort is normal. No respiratory distress.     Breath sounds: Normal breath sounds.  Musculoskeletal:     Cervical back: Normal range of motion and neck supple.     Right lower leg: No edema.     Left lower leg: No edema.  Skin:    General: Skin is warm and dry.     Findings: No rash.  Neurological:     Mental Status: She is alert and oriented to person, place, and time. Mental status is at baseline.  Psychiatric:        Mood and Affect: Mood normal.     (all labs ordered are listed, but only abnormal results are displayed) Labs Reviewed - No data to display  EKG: None  Radiology: No results found.   Procedures   Medications Ordered in the ED   methylPREDNISolone  sodium succinate (SOLU-MEDROL ) 125 mg/2 mL injection 125 mg (125 mg Intravenous Given 03/03/24 1056)  famotidine  (PEPCID ) IVPB 20 mg premix (20 mg Intravenous New Bag/Given 03/03/24 1055)  diphenhydrAMINE  (BENADRYL ) injection 25 mg (25 mg Intravenous Given 03/03/24 1055)  ondansetron  (ZOFRAN ) injection 4 mg (4 mg Intravenous Given 03/03/24 1055)                                    Medical Decision Making Risk Prescription drug management.   Monica Douglas is a 33 year old female who presented to the emergency department due to concerns for an allergic reaction  Initial Ddx:  Allergic reaction, anaphylaxis, peanut allergy, seasonal allergies  MDM/Course:  Patient presents to the emergency department due to concerns for an allergic reaction to a bar that she ate that may have contained peanuts.  She is complaining of left eye swelling and itching and hand itching.  On exam does have some periorbital edema of the left eye.  No urticarial rash noted.  Does have a globus sensation but no swelling of the posterior oropharynx.  She is hemodynamically stable.  Given IV steroids and antihistamines and was monitored for several hours.  Upon re-evaluation felt like her symptoms had resolved.  Given an EpiPen  and instructions on when/how to use it as well as instructions to follow-up with an allergist as an outpatient.  This patient presents to the ED for concern of complaints listed in HPI, this involves an extensive number of treatment options, and is a complaint that carries with it a high risk of complications and morbidity. Disposition including potential need for admission considered.   Dispo: DC Home. Return precautions discussed including, but not limited to, those listed in the AVS. Allowed pt time to ask questions which were answered fully prior to dc.  Records reviewed Outpatient Clinic Notes I have reviewed the patients home medications and made adjustments as  needed  Portions of this note were generated with Dragon dictation software. Dictation errors may occur despite best attempts at proofreading.     Final diagnoses:  Allergic reaction, initial encounter    ED Discharge Orders          Ordered    EPINEPHrine  0.3 mg/0.3 mL IJ SOAJ injection  As needed,   Status:  Discontinued        03/03/24 1230    EPINEPHrine  0.3 mg/0.3 mL IJ SOAJ injection  As needed  03/03/24 1230               Yolande Lamar BROCKS, MD 03/03/24 1435

## 2024-03-03 NOTE — ED Triage Notes (Addendum)
 Pt caox4, ambulatory c/o face swelling, itchy eyes, and throat swelling stating she feels like she is having an allergic reaction. Hx allergic reaction to peanuts Took one 25mg  benadryl  capsule approx 30 min ago. States she ate something that may have had peanuts in it approx 1 hr ago. Has never had to use epi in the past.

## 2024-03-03 NOTE — Discharge Instructions (Signed)
 You were seen for your allergic reaction in the emergency department.   At home, please use the epipen  if you develop another reaction and have difficulty breathing.    Check your MyChart online for the results of any tests that had not resulted by the time you left the emergency department.   Follow-up with your primary doctor in 2-3 days regarding your visit.  Follow-up with the allergy center.   Return immediately to the emergency department if you experience any of the following: difficulty breathing, hives, fainting, nausea and vomiting, or any other concerning symptoms.    Thank you for visiting our Emergency Department. It was a pleasure taking care of you today.
